# Patient Record
Sex: Male | Born: 1960
Health system: Southern US, Community
[De-identification: ages and names within clinical notes are randomized; demographics above are authoritative.]

## PROBLEM LIST (undated history)

## (undated) DIAGNOSIS — E785 Hyperlipidemia, unspecified: Secondary | ICD-10-CM

## (undated) DIAGNOSIS — K219 Gastro-esophageal reflux disease without esophagitis: Secondary | ICD-10-CM

## (undated) DIAGNOSIS — G5700 Lesion of sciatic nerve, unspecified lower limb: Secondary | ICD-10-CM

## (undated) DIAGNOSIS — M7541 Impingement syndrome of right shoulder: Secondary | ICD-10-CM

## (undated) DIAGNOSIS — K5730 Diverticulosis of large intestine without perforation or abscess without bleeding: Secondary | ICD-10-CM

## (undated) DIAGNOSIS — K649 Unspecified hemorrhoids: Secondary | ICD-10-CM

## (undated) DIAGNOSIS — J45909 Unspecified asthma, uncomplicated: Secondary | ICD-10-CM

## (undated) DIAGNOSIS — M109 Gout, unspecified: Secondary | ICD-10-CM

## (undated) DIAGNOSIS — K429 Umbilical hernia without obstruction or gangrene: Secondary | ICD-10-CM

## (undated) DIAGNOSIS — M1990 Unspecified osteoarthritis, unspecified site: Secondary | ICD-10-CM

## (undated) DIAGNOSIS — I861 Scrotal varices: Secondary | ICD-10-CM

## (undated) DIAGNOSIS — G709 Myoneural disorder, unspecified: Secondary | ICD-10-CM

## (undated) HISTORY — DX: Lesion of sciatic nerve, unspecified lower limb: G57.00

## (undated) HISTORY — DX: Unspecified osteoarthritis, unspecified site: M19.90

## (undated) HISTORY — DX: Diverticulosis of large intestine without perforation or abscess without bleeding: K57.30

## (undated) HISTORY — DX: Gastro-esophageal reflux disease without esophagitis: K21.9

## (undated) HISTORY — DX: Umbilical hernia without obstruction or gangrene: K42.9

## (undated) HISTORY — DX: Hyperlipidemia, unspecified: E78.5

## (undated) HISTORY — DX: Myoneural disorder, unspecified: G70.9

## (undated) HISTORY — DX: Scrotal varices: I86.1

## (undated) HISTORY — DX: Unspecified hemorrhoids: K64.9

## (undated) HISTORY — DX: Unspecified asthma, uncomplicated: J45.909

---

## 2000-11-18 ENCOUNTER — Emergency Department (HOSPITAL_COMMUNITY): Admission: EM | Admit: 2000-11-18 | Discharge: 2000-11-18 | Payer: Self-pay | Admitting: Emergency Medicine

## 2000-11-18 ENCOUNTER — Encounter: Payer: Self-pay | Admitting: Emergency Medicine

## 2002-07-03 ENCOUNTER — Encounter: Payer: Self-pay | Admitting: Emergency Medicine

## 2002-07-03 ENCOUNTER — Emergency Department (HOSPITAL_COMMUNITY): Admission: EM | Admit: 2002-07-03 | Discharge: 2002-07-03 | Payer: Self-pay | Admitting: Emergency Medicine

## 2013-05-04 ENCOUNTER — Emergency Department (HOSPITAL_COMMUNITY)
Admission: EM | Admit: 2013-05-04 | Discharge: 2013-05-05 | Disposition: A | Discharge status: Home / Self Care | Payer: No Typology Code available for payment source | Attending: Emergency Medicine | Admitting: Emergency Medicine

## 2013-05-04 ENCOUNTER — Emergency Department (HOSPITAL_COMMUNITY): Payer: No Typology Code available for payment source

## 2013-05-04 ENCOUNTER — Encounter (HOSPITAL_COMMUNITY): Payer: Self-pay | Admitting: Emergency Medicine

## 2013-05-04 DIAGNOSIS — M545 Low back pain, unspecified: Secondary | ICD-10-CM | POA: Insufficient documentation

## 2013-05-04 DIAGNOSIS — M542 Cervicalgia: Secondary | ICD-10-CM | POA: Insufficient documentation

## 2013-05-04 DIAGNOSIS — Y92410 Unspecified street and highway as the place of occurrence of the external cause: Secondary | ICD-10-CM | POA: Insufficient documentation

## 2013-05-04 DIAGNOSIS — M25519 Pain in unspecified shoulder: Secondary | ICD-10-CM | POA: Insufficient documentation

## 2013-05-04 DIAGNOSIS — M25522 Pain in left elbow: Secondary | ICD-10-CM

## 2013-05-04 DIAGNOSIS — Y939 Activity, unspecified: Secondary | ICD-10-CM | POA: Insufficient documentation

## 2013-05-04 DIAGNOSIS — M546 Pain in thoracic spine: Secondary | ICD-10-CM | POA: Insufficient documentation

## 2013-05-04 DIAGNOSIS — M25512 Pain in left shoulder: Secondary | ICD-10-CM

## 2013-05-04 DIAGNOSIS — M25529 Pain in unspecified elbow: Secondary | ICD-10-CM | POA: Insufficient documentation

## 2013-05-04 DIAGNOSIS — M549 Dorsalgia, unspecified: Secondary | ICD-10-CM

## 2013-05-04 MED ORDER — CYCLOBENZAPRINE HCL 5 MG PO TABS: 5.0000 mg | ORAL_TABLET | Freq: Three times a day (TID) | ORAL | Status: DC | PRN

## 2013-05-04 MED ORDER — NAPROXEN 500 MG PO TABS: 500.0000 mg | ORAL_TABLET | Freq: Two times a day (BID) | ORAL | Status: DC
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
## 2013-05-04 NOTE — Discharge Instructions (Signed)
Follow-up with your doctor if your symptoms are not improving or worsening Take naprosyn for pain - take twice daily with food  Take flexeril for muscle relaxation - be careful this can make your drowsy, do not drive while taking this medication Return to the ED if you have any worsening signs/symptoms, severe headache/chest pain/abdominal pain, repeated vomiting, blood in your vomit/stool/urine, weakness, loss of bowel/bladder function, loss of sensation, or any other concerns (see below)     Colisin con un vehculo de motor Furniture conservator/restorer) Luego de una colisin, es comn presentar mltiples moretones y dolores musculares. Estas molestias generalmente empeoran durante las primeras 24 horas. Usted gradualmente se pondr ms rgido y con ms dolor en las horas siguientes. Podr sentirse peor cuando despierte en la maana siguiente al accidente. A partir de all, debera comenzar a Charity fundraiser que pase. La velocidad con que se mejora generalmente depende de la gravedad de la colisin, la cantidad de lesiones y la ubicacin y Chiropractor de las mismas. INSTRUCCIONES PARA EL CUIDADO EN EL HOGAR   Aplique hielo sobre la zona lesionada.  Ponga el hielo en una bolsa plstica.  Colquese una toalla entre la piel y la bolsa de hielo.  Deje el hielo durante 15 a 20 minutos, 3 a 4 veces por da.  Debe ingerir gran cantidad de lquido para mantener la orina de tono claro o color amarillo plido.  No beba alcohol.  Tome una ducha o un bao caliente o bese una o dos veces por da. Esto aumentar el flujo de [employer]cker msculos doloridos.  Puede volver a sus ocupaciones cuando se lo indique el mdico. Tenga cuidado al levantar objetos, ya que puede agravar el dolor en el cuello o en la espalda.  Utilice los medicamentos de venta libre o de prescripcin para Conservation officer, historic buildings, Health and safety inspector o la [place], segn se lo indique el profesional que lo asiste. No tome aspirina. Podran aumentar los  hematomas o las hemorragias. [place] DE INMEDIATO SI SIENTE:  Entumecimiento, hormigueo, debilidad o problemas con el uso de los brazos o las piernas.  Dolor de cabeza intenso que no mejora con medicamentos.  Siente dolor intenso en el cuello, especialmente sensibilidad en el centro de la espalda o el cuello.  Cambios en el control del intestino o la vejiga.  Aumento del dolor en cualquier parte del cuerpo.  Falta de aire, mareos o [place].  Siente dolor en el pecho.  Nuseas, vmitos o sudoracin.  Aumento del Tree surgeon abdominal.  Sangre en la orina, en las heces o vmitos con [place].  Siente dolor en los hombros (en la zona de los breteles).  Que sus sntomas empeoran. EST SEGURO QUE:   Comprende las instrucciones para el alta mdica.  Controlar su enfermedad.  Solicitar atencin mdica de inmediato segn las indicaciones. Document Released: 01/24/2005 Document Revised: 07/09/2011 [facility] Patient Information 2014 [place], [place].  Dolor en el hombro (Shoulder Pain)  El hombro es la articulacin que une los brazos al cuerpo. Los Affiliated Computer Services que forman la articulacin del hombro son el hueso del brazo (hmero), el omplato (escpula) y Teacher, early years/pre. La parte superior del hmero es similar a una bola y Sales promotion account executive en una cavidad ms bien plana de la escpula (cavidad glenoidea). Una combinacin de msculos y tejidos fuertes y fibrosos que Eli Lilly and Company msculos a los huesos (tendones) sostienen la articulacin del hombro y Tree surgeon la bola en la cavidad. En diferentes zonas de la articulacin hay pequeas bolsas llenas de  lquido (bursa). Actan como amortiguadores Monsanto Company y los tejidos blandos que recubren y [place] a reducir la friccin [employer] tendones y [place], deslizndose al Forensic scientist. La articulacin del hombro permite una amplia gama de movimientos del brazo. Este rango de movimientos permite hacer diferentes cosas, como rascarse la espalda o  lanzar una pelota. Sin embargo, esta amplitud de movimientos del hombro tambin lo hace ms propenso al dolor por uso excesivo y por lesiones.  Las causas de dolor en el hombro pueden originarse tanto en traumatismos como en el uso excesivo y se pueden agrupar en las siguientes cuatro categoras:   Enrojecimiento, hinchazn y dolor (inflamacin) del tendn (tendinitis) o la bursa (bursitis).  Inestabilidad, como en la luxacin de la articulacin.  Inflamacin de la articulacin (artritis).  Ruptura del hueso (fractura). INSTRUCCIONES PARA EL CUIDADO EN EL HOGAR   Aplique hielo sobre la zona dolorida.  Ponga el hielo en una bolsa plstica.  Colquese una toalla entre la piel y la bolsa de hielo.  Deje el hielo durante 15 a 20 minutos 3 a 4 veces por da, durante los 2 primeros das.  Deje de usar compresas fras si no Forensic psychologist.  Si tiene un cabestrillo o inmovilizador de hombro, llvelo del modo en que su mdico le indique. Slo debe quitarlo para ducharse o baarse. Mueva el brazo lo menos posible, pero mantenga la mano en movimiento para evitar la hinchazn.  Apriete una pelota blanda o una almohadilla de goma todo lo posible para evitar la hinchazn.   Slo tome medicamentos de venta libre o recetados para Conservation officer, historic buildings, Health and safety inspector o la fiebre, segn las indicaciones de su mdico. SOLICITE ATENCIN MDICA SI:   El dolor en el hombro aumenta o siente un dolor nuevo en el brazo, la mano o los dedos.  La mano o los dedos estn fros y adormecidos.  El dolor no se alivia con los [employer]ne[employer]. SOLICITE ATENCIN MDICA DE INMEDIATO SI:   El brazo, la mano o los dedos estn adormecidos o siente hormigueos.  El brazo, la mano o los dedos estn muy hinchados o se ven blancos o azules. ASEGRESE DE QUE:   Comprende estas instrucciones.  Controlar su enfermedad.  Solicitar ayuda de inmediato si no mejora o empeora. Document Released: 01/24/2005 Document Revised:  01/09/2012 [facility] Patient Information 2014 [place], [place].  Distensin cervical  (Cervical Sprain)  Una distensin cervical es una lesin en el cuello en la que los ligamentos se estiran o se rompen. Los ligamentos son tejidos que sostienen los huesos del cuello en su Environmental consultant. Una distensin cervical puede ser desde muy leve a muy grave. La mayora mejora en 1 a 3 semanas, pero depende de la causa y la extensin de la lesin. En los casos graves pueden hacer que las vrtebras del cuello se vuelvan inestables. Esto puede causar un dao en la mdula espinal y puede dar Environmental consultant a graves problemas del [place]. Su mdico determinar si su su caso es leve o grave.  CAUSAS Las causas de una distensin cervical grave pueden ser:   Pecola Leisure prctica de ftbol americano, rugby, [place], hockey, automovilismo, gimnasia, buceo, artes Reynolds American y boxeo.  Colisiones en vehculos de motor.  Lesiones de Psychologist, counselling. Esto significa que el cuello se fuerza hacia atrs y [place].  Cadas. [place] distensiones cervicales leves pueden ser:   Adoptar posiciones incmodas, como sostener el telfono entre la oreja y [place].  Sentarse en  una silla que no ofrece el soporte adecuado.  Trabajar en una mesa de computadora mal diseada.  Las Deere & Company que requieren mirar hacia arriba o hacia abajo durante largos perodos. SNTOMAS  Dolor, sensibilidad, rigidez, o sensacin de ardor en la parte anterior, posterior o lateral del cuello. Este malestar puede desarrollarse inmediatamente despus de la lesin o puede desarrollarse lentamente y no empezar hasta 24 horas o ms despus de la lesin.  Dolor o sensibilidad que se siente directamente en la parte media posterior del cuello.  Dolor en el hombro o la zona superior de la espalda.  Capacidad limitada para mover el cuello.  Dolor de [place].  Mareos.  Debilidad, entumecimiento u hormigueo en las manos o los brazos.  Espasmos  musculares.  Dificultad para tragar o masticar.  Sensibilidad e hinchazn en el cuello. [place] veces, el mdico puede diagnosticar este problema mediante la historia clnica y un examen fsico. Su mdico le preguntar acerca de problemas conocidos,como artritis en el cuello o una lesin previa en el cuello. Podrn tomarle radiografas para determinar si hay otros problemas, como enfermedades en los huesos del cuello. Sin embargo, en general las radiografas no revelan una distensin cervical en su totalidad. Puede ser necesario realizar otras pruebas, como una tomografa computada o la resonancia magntica.  TRATAMIENTO El tratamiento depende de la gravedad de la distensin. Las distensiones leves se pueden tratar con reposo, manteniendo el cuello en su lugar (inmobilizacin) y usando medicamentos para Conservation officer, historic buildings. Las distensiones cervicales graves necesitan inmovilizacin inmediata y Ardis Rowan con un ortopedista o neurocirujano. Hay varias opciones de tratamiento disponibles para calmar el dolor, los espasmos musculares y otros sntomas. Su mdico puede recetar:   Medicamentos como calmantes para Conservation officer, historic buildings, anestsicos o relajantes musculares.  Fisioterapia. Esto puede incluir ejercicios de elongacin, fortalecimiento y Chiropodist de Advertising copywriter. Los ejercicios y [place] mejor postura pueden ayudar a estabilizar el cuello, fortalecer los msculos y Product/process development scientist que los sntomas regresen.  El uso de un collar durante cortos perodos de [place]. Generalmente estos collares se usan para aumentar la comodidad. Sin embargo, ciertos collares pueden usarse   para proteger el cuello y Product/process development scientist un mayor deterioro de una distensin cervical grave. CUIDADOS EN EL HOGAR  Aplique hielo sobre la zona lesionada.  Ponga el hielo en una bolsa plstica.  Colquese una toalla entre la piel y la bolsa de hielo.  Deje el hielo durante 15 a 20 minutos, 3 a 4 veces por da.  Slo tome medicamentos de  venta libre o prescriptos para Glass blower/designer, las molestias o bajar la fiebre segn las indicaciones de su mdico.  Cumpla con todas las visitas de control, segn le indique su mdico.  Cumpla con todas las sesiones de fisioterapia, segn le indique su mdico.  Si le indican el uso de un collar, selo segn las indicaciones del mdico.  No conduzca vehculos mientras [place] el collar.  Haga los ajustes necesarios en su lugar de trabajo para favorecer una buena postura.  Evite las posiciones y actividades que ONEOK sntomas.  Haga precalentamiento y elongue antes de comenzar una actividad para Customer service manager. SOLICITE ATENCIN MDICA SI:   El dolor no cesa con Haematologist.  Siente que no puede dejar de Actuary como se le indic.  No puede mejorar el nivel de actividad segn lo planeado/esperado. SOLICITE ATENCIN MDICA DE INMEDIATO SI:   Tiene algn sangrado, molestias en el estmago o signos de reaccin alrgica por  los medicamentos.  Los sntomas empeoran.  Le aparecen nuevos e inexplicables sntomas.  Siente debilidad, hormigueo, adormecimiento o parlisis en alguna parte del cuerpo. ASEGRESE DE QUE:   Comprende esas instrucciones para el alta mdica.  Controlar su enfermedad.  Solicitar ayuda de inmediato si no mejora o si empeora. Document Released: 07/13/2008 Document Revised: 07/09/2011 [facility] Patient Information 2014 [place], [place].  Dolor de espalda en el adulto (Back Pain, Adult)  El dolor de cintura es frecuente. Aproximadamente 1 de cada 5 personas lo sufren.La causa rara vez pone en peligro la vida. Con frecuencia mejora luego de algn tiempo.Alrededor de la mitad de las personas que sufren un inicio sbito de dolor de cintura, se sentirn mejor luego de 2 semanas. Aproximadamente 8 de cada 10 se sentirn mejor luego de 6 semanas.  CAUSAS  Algunas causas comunes son:   Distensin de los msculos o ligamentos que  sostienen la columna vertebral.  Desgaste (degeneracin) de los discos vertebrales.  Artritis.  Traumatismos directos en la espalda. DIAGNSTICO  La mayor parte de las veces, la causa directa no se conoce.Sin embargo, Conservation officer, historic buildings puede tratarse efectivamente an cuando no se Community education officer.Una de las formas ms precisas de asegurar que la causa del dolor no constituye un peligro es responder a las preguntas del mdico acerca de su salud y sus sntomas. Si el mdico necesita ms informacin, podr indicar anlisis de laboratorio o Optometrist un diagnstico por imgenes (radiografas o Health visitor).Sin embargo, aunque las Valero Energy modificaciones, generalmente no es necesaria la [place].  INSTRUCCIONES PARA EL CUIDADO EN EL HOGAR  En algunas personas, el dolor de espalda vuelve.Como rara vez es peligroso, los pacientes pueden aprender a Education administrator.   Mantngase activo. Si permanece sentado o de pie mucho tiempo en el mismo lugar, se tensiona la espalda.  No se siente, maneje ni se quede parado en un mismo lugar por ms de 30 minutos. Realice caminatas cortas en superficies planas ni bien el dolor haya cedido. Trate de Orthoptist tiempo que camina .  No se quede en la cama.Si hace reposo durante ms de 1 o 2 das, puede Geologist, engineering.  No evite los ejercicios ni el trabajo.El cuerpo est hecho para moverse.No es peligroso estar [place], aunque le duela la espalda.La espalda se curar ms rpido si contina sus actividades antes de que el dolor se vaya.  Preste atencin a su cuerpo cuando se incline y se levante. Muchas personas sienten menos molestias cuando levantan objetos si doblan las rodillas, mantienen la carga cerca del cuerpo y evitan torcerse. Generalmente, las posiciones ms cmodas son las que ejercen menos tensin en la espalda en recuperacin.  Encuentre una posicin cmoda para dormir. Utilice un colchn firme y recustese de  [place]. Doble ligeramente sus rodillas. Si se recuesta sobre su espalda, coloque una almohada debajo de sus rodillas.  Tome slo medicamentos de venta libre o recetados, segn las indicaciones del mdico. Los medicamentos de venta libre para Glass blower/designer y reducir Futures trader, son los que en general ms ayudan.El mdico podr prescribirle relajantes musculares.Estos medicamentos calman el dolor de modo que pueda retornar a sus actividades normales y a Marine scientist.  Aplique hielo sobre la zona lesionada.  Ponga el hielo en una bolsa plstica.  Colquese una toalla entre la piel y la bolsa de hielo.  Deje la bolsa de hielo durante 15 a 20 minutos 3 a 4 veces por da, durante los primeros 2  3  das. Elveria Royals podr alternar Lyndal Pulley calor y hielo para reducir Conservation officer, historic buildings y los espasmos.  Consulte a su mdico si puede tratar de hacer ejercicios para la espalda y recibir un masaje suave. Pueden ser beneficiosos.  Evite sentirse ansioso o estresado.El estrs aumenta la tensin muscular y puede empeorar el dolor de espalda.Es importante reconocer cuando est ansioso o estresado y aprender la forma de controlarlos.El ejercicio es una gran opcin. SOLICITE ATENCIN MDICA SI:   Siente un dolor que no se alivia con reposo o medicamentos.  El dolor no mejora en 1 semana.  Desarrolla nuevos sntomas.  No se siente bien en general. SOLICITE ATENCIN MDICA DE INMEDIATO SI:  Siente un dolor que se irradia desde la espalda hacia sus piernas.  Desarrolla nuevos problemas en el intestino o la vejiga.  Siente debilidad o adormecimiento inusual en sus brazos o piernas.  Presenta nuseas o vmitos.  Presenta dolor abdominal.  Se siente desfalleciente. Document Released: 04/16/2005 Document Revised: 10/16/2011 [facility] Patient Information 2014 [place], [place].   Emergency Department Resource Guide 1) Find a Doctor and Pay Out of Pocket Although you won't have to find out  who is covered by your insurance plan, it is a good idea to ask around and get recommendations. You will then need to call the office and see if the doctor you have chosen will accept you as a new patient and what types of options they offer for patients who are self-pay. Some doctors offer discounts or will set up payment plans for their patients who do not have insurance, but you will need to ask so you aren't surprised when you get to your appointment.  2) Contact Your Local Health Department Not all health departments have doctors that can see patients for sick visits, but many do, so it is worth a call to see if yours does. If you don't know where your local health department is, you can check in your phone book. The CDC also has a tool to help you locate your state's health department, and many state websites also have listings of all of their local health departments.  3) Find a [facility] If your illness is not likely to be very severe or complicated, you may want to try a walk in clinic. These are popping up all over the country in pharmacies, drugstores, and shopping centers. They're usually staffed by nurse practitioners or physician assistants that have been trained to treat common illnesses and complaints. They're usually fairly quick and inexpensive. However, if you have serious medical issues or chronic medical problems, these are probably not your best option.  No Primary Care Doctor: - Call Health Connect at  [phone] - they can help you locate a primary care doctor that  accepts your insurance, provides certain services, etc. - Physician Referral Service- [phone]  Chronic Pain Problems: Organization         Address  Phone   Notes  [facility]  [phone] Patients need to be referred by their primary care doctor.   Medication Assistance: Organization         Address  Phone   Notes  [facility] Medication [facility] [address] [place] [place], [place] [address] [phone] --Must be a resident of [facility] -- Must have NO insurance coverage whatsoever (no Medicaid/ Medicare, etc.) -- The pt. MUST have a primary care doctor that directs their care regularly and follows them in the community   MedAssist  (  [phone]   [employer]  [phone]    Agencies that provide inexpensive medical care: Organization         Address  Phone   Notes  [employer]  [phone]   [facility]    [phone]   [facility] [place], [place] [address] [phone]   [place] [address] [place] [phone]   Planned Parenthood    [phone]   [facility]    [phone]   [place] and [address], [place] Phone:  [phone], Fax:  [phone] Hours of Operation:  9 am - 6 pm, M-F.  Also accepts Medicaid/Medicare and self-pay.  [facility] for [place], [address], [place] Phone: [phone], Fax: [phone]. Hours of Operation:  8:30 am - 5:30 pm, M-F.  Also accepts Medicaid and self-pay.  [facility] [place] [facility] [address], [place] Phone: [phone]   [place], [place], [place] [phone], Ext. [phone] Mondays & Thursdays: 7-9 AM.  First 15 patients are seen on a first come, first serve basis.    [place] Providers:  Organization         Address  Phone   Notes  [facility] [address], [place] [phone] Also accepts self-pay patients.  [facility] [id number] [place], [place]  [phone]   [place], [address], [place] [phone]   [facility] [address] [place] [phone]   Lucianne Lei  [address], [place]   [phone] Only accepts [place] Access [place] patients after they have their name applied to their card.   Self-Pay (no insurance) in [facility]:  Organization         Address  Phone   Notes  Sickle Cell Patients, [facility] [place] [phone]   [facility] [place] [phone]   [facility] [place]  [place], [place], [place] [phone]   [facility]/Dr. Osei-Bonsu  [address], [place] or [address], [place] [phone] Phone number for both [place] and [place] locations is the same.  [facility] [address], [place] [phone]   [facility] [address], [place] or [address] [phone]   [facility] [address], [place] [phone], phone; [phone], fax Sees patients 1st and 3rd Saturday of every month.  Must not qualify for public or private insurance (i.e. Medicaid, Medicare, [place] Health Choice, Veterans' Benefits)  Household income should be no more than 200% of the poverty level The clinic cannot treat you if you are pregnant or think you are pregnant  Sexually transmitted diseases are not treated at the clinic.    Dental Care: Organization         Address  Phone  Notes  [facility] [place] [phone] Accepts children up to age 62 who are enrolled in [place] or [place]; pregnant women with  a Medicaid card; and children who have applied for Medicaid or [place] Health Choice, but were declined, whose parents can pay a reduced fee at time of service.  [facility] Department of [facility]  [address], [place] [phone] Accepts children up to age 14  who are enrolled in [place] or [place]; pregnant women with a Medicaid card; and children who have applied for Medicaid or [place] Health Choice, but were declined, whose parents can pay a reduced fee at time of service.  [place] Adult [facility] Access [facility]  [place] [phone] Patients are seen by appointment only. Walk-ins are not accepted. [place] will see patients 33 years of age and older. Monday - Tuesday (8am-5pm) Most Wednesdays (8:30-5pm) $30 per visit, cash only  [facility] Adult Dental Access PROGRAM  [address], [facility] [phone] Patients are seen by appointment only. Walk-ins are not accepted. [place] will see patients 66 years of age and older. One Wednesday Evening (Monthly: Volunteer Based).  $30 per visit, cash only  [place]  [phone] for adults; Children under age 72, call Graduate Pediatric Dentistry at [phone]. Children aged 58-14, please call [phone] to request a pediatric application.  Dental services are provided in all areas of dental care including fillings, crowns and bridges, complete and partial dentures, implants, gum treatment, root canals, and extractions. Preventive care is also provided. Treatment is provided to both adults and children. Patients are selected via a lottery and there is often a waiting list.   [facility] [address] [place]  [phone] [email or web address]   [facility] [address] [place], [place] [phone], Ext. [phone] Second and Fourth Thursday of each month, opens at 6:30 AM; Clinic ends at 9 AM.  Patients are seen on a first-come first-served basis, and a limited number are seen during each clinic.   [facility]  [address] Hillard Danker [place], [place] [phone]   Eligibility Requirements You must have lived in [place], [place], or [place] counties for at least the last three months.    You cannot be eligible for state or federal sponsored [employer], including [employer], [place], or [employer].   You generally cannot be eligible for healthcare insurance through your employer.    How to apply: Eligibility screenings are held every Tuesday and Wednesday afternoon from 1:00 pm until 4:00 pm. You do not need an appointment for the interview!  [facility] [address], [place], [place]   [place]  [place] Department  [place]  [phone]    Behavioral Health Resources in the Community: Intensive Outpatient Programs Organization         Address  Phone  Notes  [place]. [address] [place], [place] [phone]   [facility] [address] [place] [place], [place]   ADS: [facility] [address] [place], [place]   [place] [address],  [place], [place] or [phone]   Substance Abuse Resources Organization         Address  Phone  Notes  [facility]  [phone]   [facility]  [phone]   [facility] [place]   [facility]  [phone]   Residential & Outpatient Substance Abuse Program  [phone]   Psychological Services Organization         Address  Phone  Notes  [facility]  Campus  [phone]   [place] [address] [place] or [phone]    Mobile Crisis Teams Organization         Address  Phone  Notes  [facility], Mobile Crisis Care Unit  [phone]   [facility]  [address] [place], [place]   [place] Levels [address], [place] [phone]    Self-Help/Support  Groups Organization         Address  Phone             Notes  [place] [facility] [place] - variety of support groups  [place] Call for more information  Narcotics Anonymous (NA), [facility] [address], [employer] [place]  2 meetings at this location   Special educational needs teacher         Address  Phone  Notes  [facility] [place],    [place]  [phone]   [facility]  [address], [place] [address], [place], [place]   [place], [employer] [phone] Admissions: 8am-3pm M-F  Incentives Substance [place] [address]    [place], [place] [phone]   [facility] [address] [place], [place], [place]   [facility] [address],  [place], [place]   [facility] - Intensive Outpatient [address] Kristeen Mans [address], [place], [place]   [facility] ([place]  [place], [place] [phone] or [phone]   [facility] ([facility]) [address], [place], [place] Accepts Medicaid  [facility] [place] [address]  [place] [phone] Substance Abuse/Addiction Treatment   [facility] Organization         Address  Phone  Notes  [facility]  [phone]   Domenic Schwab, PhD [address] Arlis Porta [place], [place]   [phone] or [phone]   [facility] [place], [place] [phone]   [facility] [address], [place], [place] [phone] Insurance/Medicaid/sponsorship through [facility] and Families [address], [address] [place]                                    [place], [place] [phone] [place] [address], [place] [phone]    Dr. Adele Schilder  [phone]   [facility] 1) [address], [place] 2) [place] 3)  [place], [place] [phone]  [phone]   [place] [phone] or [phone] (After Hours)

## 2013-05-08 NOTE — ED Provider Notes (Signed)
Medical screening examination/treatment/procedure(s) were performed by non-physician practitioner and as supervising physician I was immediately available for consultation/collaboration.  Christopher J. Pollina, MD 05/08/13 0758 

## 2015-04-01 ENCOUNTER — Ambulatory Visit: Payer: Worker's Compensation

## 2015-04-01 ENCOUNTER — Ambulatory Visit (INDEPENDENT_AMBULATORY_CARE_PROVIDER_SITE_OTHER): Payer: Worker's Compensation | Admitting: Family Medicine

## 2015-04-01 DIAGNOSIS — R0789 Other chest pain: Secondary | ICD-10-CM

## 2015-04-01 DIAGNOSIS — M25522 Pain in left elbow: Secondary | ICD-10-CM

## 2015-04-01 DIAGNOSIS — M542 Cervicalgia: Secondary | ICD-10-CM

## 2015-04-01 DIAGNOSIS — M25512 Pain in left shoulder: Secondary | ICD-10-CM

## 2015-04-01 DIAGNOSIS — M549 Dorsalgia, unspecified: Secondary | ICD-10-CM | POA: Diagnosis not present

## 2015-04-01 MED ORDER — CYCLOBENZAPRINE HCL 5 MG PO TABS: ORAL_TABLET | ORAL | Status: DC
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
## 2015-04-05 ENCOUNTER — Ambulatory Visit (INDEPENDENT_AMBULATORY_CARE_PROVIDER_SITE_OTHER): Payer: Worker's Compensation | Admitting: Family Medicine

## 2015-04-05 DIAGNOSIS — S5002XD Contusion of left elbow, subsequent encounter: Secondary | ICD-10-CM | POA: Diagnosis not present

## 2015-04-05 DIAGNOSIS — S161XXD Strain of muscle, fascia and tendon at neck level, subsequent encounter: Secondary | ICD-10-CM

## 2015-04-05 DIAGNOSIS — S39012D Strain of muscle, fascia and tendon of lower back, subsequent encounter: Secondary | ICD-10-CM | POA: Diagnosis not present

## 2015-04-05 NOTE — Patient Instructions (Addendum)
Flexeril (receta a [facility]) si necesario en la noche por musculos.  papel de restrictiones a su trabaja.  Ibuprofen si necesario.  regrese en 3 dias. Mas temprano si empeorse.
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
## 2015-04-08 ENCOUNTER — Ambulatory Visit (INDEPENDENT_AMBULATORY_CARE_PROVIDER_SITE_OTHER): Payer: Worker's Compensation | Admitting: Family Medicine

## 2015-04-08 ENCOUNTER — Ambulatory Visit: Payer: Worker's Compensation

## 2015-04-08 VITALS — BP 120/74 | HR 85 | Temp 97.8°F | Resp 16 | Ht 60.25 in | Wt 159.6 lb

## 2015-04-08 DIAGNOSIS — M545 Low back pain, unspecified: Secondary | ICD-10-CM

## 2015-04-08 DIAGNOSIS — S161XXD Strain of muscle, fascia and tendon at neck level, subsequent encounter: Secondary | ICD-10-CM

## 2015-04-08 MED ORDER — MELOXICAM 7.5 MG PO TABS: 7.5000 mg | ORAL_TABLET | Freq: Every day | ORAL | Status: DC

## 2015-04-08 NOTE — Patient Instructions (Signed)
meloxicam 1 a 2 veces cada dia.  No otro medicina que tylenol y cyclobenzaprine con esta medicina. regrese en 5 dias. Mas temprano si empeorse.   Distensin cervical (Cervical Sprain) Una distensin cervical es una lesin en el cuello, en la que los tejidos fuertes y fibrosos (ligamentos) que unen los huesos del cuello, se distienden o se rompen. Una distensin cervical puede ser desde muy leve a muy grave. En los casos graves pueden hacer que las vrtebras del cuello se vuelvan inestables. Esto puede causar un dao en la mdula espinal y puede dar Environmental consultant a graves problemas del [place]. La cantidad de tiempo que demora la mejora de una distensin cervical depende de la causa y de la extensin de la lesin. [place] veces se cura en 1 a 3 semanas. CAUSAS  Las distensiones graves pueden ser causadas por:   Lesiones por deportes de contacto (como en el ftbol Scientist, forensic, rugby, [place], hockey, automovilismo, gimnasia, buceo, artes Reynolds American y boxeo).  Colisiones en vehculos de motor.  Lesiones de Buyer, retail cervical. Esta es una lesin por movimiento brusco de adelante hacia atrs de la cabeza y el cuello.  Cadas. [place] distensiones cervicales leves pueden ser:   Adoptar posiciones incmodas, como sostener el telfono entre la oreja y [place].  Sentarse en una silla que no ofrece el soporte adecuado.  Trabajar en una mesa de computadora mal diseada.  Las [employer]ere [employer] Company que requieren mirar hacia arriba o hacia abajo durante largos perodos. SNTOMAS   Dolor, sensibilidad, rigidez, o sensacin de ardor en la parte anterior, posterior o lateral del cuello. Este malestar puede aparecer inmediatamente despus de la lesin o puede desarrollarse lentamente y no empezar hasta 24 horas o ms despus de la lesin.  Dolor o sensibilidad que se siente directamente en la parte media posterior del cuello.  Dolor en el hombro o la zona superior de la espalda.  Capacidad  limitada para mover el cuello.  Dolor de [place].  Mareos.  Debilidad, entumecimiento u hormigueo en las manos o los brazos.  Espasmos musculares.  Dificultades para tragar o comer.  Sensibilidad e hinchazn en el cuello. [place] veces, el mdico puede diagnosticar este problema mediante la historia clnica y un examen fsico. Su mdico le preguntar acerca de lesiones previas y problemas conocidos como artritis en el cuello. Podrn tomarle radiografas para determinar si hay otros problemas, como enfermedades en los huesos del cuello. Tambin puede ser [employer]state realizar otras pruebas, como tomografas computadas o Health visitor.  TRATAMIENTO  El tratamiento depende de la gravedad de la distensin. Las distensiones leves se pueden tratar con reposo, manteniendo el cuello en su lugar (inmovilizacin) y usando medicamentos para Conservation officer, historic buildings. Las distensiones graves deben ser inmediatamente inmovilizadas. Ser necesario completar el tratamiento para Best boy, los espasmos musculares y otros sntomas, y puede incluir.  Medicamentos como calmantes para el dolor, anestsicos o relajantes musculares.  Fisioterapia. Esto puede incluir ejercicios de elongacin, fortalecimiento y Chiropodist de Advertising copywriter. Los ejercicios y [place] mejor postura pueden ayudar a estabilizar el cuello, fortalecer los msculos y evitar que los sntomas vuelvan a Arts administrator. INSTRUCCIONES PARA EL CUIDADO EN EL HOGAR   Aplique hielo sobre la zona lesionada.  Ponga el hielo en una bolsa plstica.  Colquese una toalla entre la piel y la bolsa de hielo.  Deje el hielo durante 15 - 20 minutos y aplquelo 3 - 4 veces por Training and development officer.  Si la lesin fue  grave, le indicarn el uso de un collarn cervical. El collarn cervical es un collar de dos piezas para impedir que el cuello se mueva [place] se [place].  Nose quite el collarn excepto que se lo indique su mdico.  Si tiene el cabello largo,  mantngalo fuera del collarn.  Consulte a su mdico antes de hacerle ajustes. Los [employer]ot pueden ser requeridos con el tiempo para Garment/textile technologist confort y reducir la presin sobre la barbilla o en la parte posterior de la cabeza.  Si le permiten quitarse el collarn para lavarlo o darse un bao, siga las indicaciones de su mdico acerca de cmo hacerlo con seguridad.  Mantenga el collarn limpio pasando un pao con agua y [place] y secndolo bien. Si el collarn tiene almohadillas removibles, qutelas cada 1-2 das para lavarlas a mano con agua y [place]. Deje que se sequen al aire. Debe secarlas bien antes de volver a colocarlas en el collarn.  Si le permiten quitarse el collarn para lavarlo y darse un bao, lave y seque la piel del cuello. Controle su piel para detectar irritacin o llagas. Si las tiene, informe a su mdico.  No conduzca vehculos mientras [place] el collarn.  Slo tome medicamentos de venta libre o recetados para Glass blower/designer, el malestar o bajar la fiebre, segn las indicaciones de su mdico.  Cumpla con todas las visitas de control, segn le indique su mdico.  Cumpla con todas las sesiones de fisioterapia, segn le indique su mdico.  Haga los ajustes necesarios en su lugar de [place] para favorecer una buena postura.  Evite las posiciones y actividades que ONEOK sntomas.  Haga precalentamiento y elongue antes de comenzar una actividad para Customer service manager. SOLICITE ATENCIN MDICA SI:   El dolor no se alivia con los [employer]negy.  No puede disminuir la dosis de analgsicos segn lo planificado.  Su nivel de actividad no mejora segn lo esperado. SOLICITE ATENCIN MDICA DE INMEDIATO SI:   Presenta cualquier hemorragia.  Siente Higher education careers adviser.  Tiene signos de reaccin alrgica a los medicamentos.  Los sntomas empeoran.  Le aparecen sntomas nuevos e inexplicables.  Siente adormecimiento, hormigueo, debilidad o parlisis en alguna  parte del cuerpo. ASEGRESE DE QUE:   Comprende estas instrucciones.  Controlar su afeccin.  Recibir ayuda de inmediato si no mejora o si empeora.   Esta informacin no tiene Marine scientist el consejo del mdico. Asegrese de hacerle al mdico cualquier pregunta que tenga.   Document Released: 07/13/2008 Document Revised: 02/04/2013 Elsevier Interactive Patient Education 2016 [place] con rehabilitacin (Low Back Sprain With Rehab) Un esguince es una lesin en la que el ligamento se desgarra. Los ligamentos de la cintura son susceptibles de sufrir esguinces. Sin embargo, estos ligamentos son Orlene Erm fuertes y se requiere de una gran fuerza para lesionarlos. Son importantes para estabilizar la mdula [place] esguinces se clasifican en tres categoras. Los esguinces de grado 1 ocasionan dolor, pero el tendn no est alargado. En los esguinces de grado 2 hay un ligamento alargado, debido a un estiramiento o desgarro parcial. En el esguince de [place] 2 an se mantiene la funcin, aunque sta puede estar alterada. Un esguince en grado 3 es la ruptura completa del msculo o el tendn, y suele quedar incapacitada la funcin. SNTOMAS  Dolor intenso en la cintura.  Sensacin de estallido o ruptura en el momento de la lesin.  Sensibilidad y a veces hinchazn en la zona de la lesin.  Algunas  veces, hematoma (contusin) en el lugar de la lesin dentro de las 48 horas.  Espasmos musculares en la espalda. CAUSAS El esguince se produce cuando se aplica una fuerza en el ligamento que es mayor de lo que puede soportar. Las causas ms frecuentes de la lesin son:  Sherrye Payor actividad estresante en una posicin incmoda.  Actividades estresantes repetidas que implican movimiento de la cintura.  [place] directo en la cintura (traumatismo). LOS RIESGOS AUMENTAN CON:  Deportes de contacto (ftbol, lucha).  Colisiones (principalmente accidentes de  esqu).  Deportes que requieren arrojar o Retail banker elemento (levantamiento de pesas, bisbol).  Deportes que implican girar la columna (gimnasia, clavados, tenis, golf)  Poca fuerza y flexibilidad.  Proteccin inadecuada.  Cirugas previas en la espalda (especialmente fusin). PREVENCIN  Use el equipo protector adecuado y ONEOK.  Precalentamiento adecuado y elongacin antes de la [place].  Descanso y recuperacin entre actividades.  Mantener la forma fsica:  Kerry Hough, flexibilidad y resistencia muscular.  Capacidad cardiovascular.  Mantenga un peso corporal adecuado. PRONSTICO Si se trata adecuadamente, estos esguinces pueden curarse con tratamiento no quirrgico. El tiempo de curacin depende de la gravedad de la lesin.  posibles complicaciones:  La recurrencia frecuente de los sntomas puede dar como resultado un problema crnico.  Inflamacin crnica y dolor en la cintura.  Retraso en la curacin o resolucin de los sntomas, en particular si se retoma la actividad rpidamente.  Discapacidad prolongada.  Articulacin inestable o artrtica en la cintura. TRATAMIENTO El tratamiento inicial incluye el uso de medicamentos y la aplicacin de hielo para reducir Conservation officer, historic buildings y la inflamacin. Los ejercicios de elongacin y fortalecimiento pueden ayudar a reducir Conservation officer, historic buildings con la [place]. Los ejercicios pueden Press photographer o con un terapeuta. Los Apple Computer graves pueden requerir la derivacin a un fisioterapeuta para Film/video editor evaluacin y Medical laboratory scientific officer un tratamiento, como ultrasonido. El profesional podr indicarle el uso de un dispositivo ortopdico para ayudar a Dietitian y la inflamacin. A menudo, demasiado reposo en cama podr resultar en ms daos que beneficios. Podrn prescribirle inyecciones de corticoides. Sin embargo, esto deber reservarse para los casos ms graves. Es Theatre manager uso de la espalda cuando se levantan objetos.  Por la noche, se aconseja que usted [place], sobre un colchn firme y coloque una almohada debajo de las rodillas. Si no se obtiene xito con Music therapist, ser necesario someterse a [place].  MEDICAMENTOS   Si es necesaria la administracin de medicamentos para Conservation officer, historic buildings, se recomiendan los antiinflamatorios no esteroides, como aspirina e ibuprofeno y otros calmantes menores, como acetaminofeno.  No tome medicamentos para el dolor dentro de los 7 das previos a la [place].  El profesional podr prescribirle calmantes si lo considera necesario. Utilcelos como se le indique y slo cuando lo necesite.  Podr beneficiarse con [employer] Cla[employer]orne.  En algunos casos se indica una inyeccin de corticosteroides. Estas inyecciones deben reservarse para los casos graves, porque slo se pueden administrar una determinada cantidad de veces. CALOR Y FRO   El fro (con hielo) debe aplicarse durante 10 a 15 minutos cada 2  3 horas para reducir la inflamacin y Conservation officer, historic buildings e inmediatamente despus de cualquier actividad que agrava los sntomas. Utilice bolsas o un masaje de hielo.  El calor puede usarse antes de Neurosurgeon y [place] fortalecimiento indicadas por el profesional, le fisioterapeuta o Industrial/product designer. Utilice una bolsa trmica o un pao hmedo. SOLICITE  ATENCIN MDICA SI:   Los sntomas empeoran o no mejoran en 2 a 4 semanas, an realizando Lexicographer.  Presenta adormecimiento o debilidad en alguna de las piernas.  Prdida del control del intestino o de la vejiga.  Luego de la ciruga observa lo siguiente: fiebre, dolor intenso, hinchazn, enrojecimiento, drena lquido o sangra en la regin de la herida.  Desarrolla nuevos e inexplicables sntomas. (Los Dynegy utilizados en el tratamiento le ocasionan efectos secundarios). [place] personas con  dolor de espalda baja encuentran que sus sntomas empeoran al doblarse hacia adelante (flexin) o al arquear la regin inferior de la espalda (extensin). Los ejercicios que le ayudarn a Investment banker, operational sus sntomas se Furniture conservator/restorer.  El mdico, fisioterapeuta o Radiation protection practitioner ayudarn a Teacher, adult education qu ejercicios sern de ayuda para resolver su dolor de espalda. No realice ningn ejercicio sin consultarlo antes con el profesional. Discontine los ejercicios que empeoran sus sntomas, hasta que hable con el mdico. Si siente dolor, entumecimiento u hormigueo que [employer] Wholesale glteos, piernas o pies, el objetivo de esta terapia es que estos sntomas se acerquen a la espalda y Occupational hygienist. A veces, estos sntomas en las piernas mejoran, pero el dolor de espalda empeora. Este suele ser un indicio de progreso en su rehabilitacin. Asegrese de que estar atento a cualquier cambio en sus sntomas y las actividades que ha General Electric 24 horas antes del cambio. Compartir esta informacin con su mdico le permitir un mejor tratamiento para tratar su enfermedad. Estos ejercicios le ayudarn en la recuperacin de la lesin. Los sntomas podrn aliviarse con o sin asistencia adicional de su mdico, fisioterapeuta o Administrator, sports. Al completar estos ejercicios, recuerde:   Restaurar la flexibilidad del tejido ayuda a que las articulaciones recuperen el movimiento normal. Esto permite que el movimiento y la actividad sea ms saludables y menos dolorosos.  Para que sea efectiva, cada elongacin debe realizarse durante al menos 30 segundos.  La elongacin nunca debe ser dolorosa. Deber sentir slo un alargamiento o distensin suave del tejido que estira. EJERCICIOS DE AMPLITUD DE MOVIMIENTOS Y ELONGACIN: ELONGACION Flexin - una rodilla al pecho  Recustese en una cama dura o sobre el piso, con ambas piernas extendidas al frente.  Manteniendo una pierna en contacto con el piso,  lleve la rodilla opuesta al pecho. Mantenga la pierna en esa posicin, sostenindola por la zona posterior del muslo o por la rodilla.  Presione hasta sentir un suave estiramiento en la cintura. Mantenga esta posicin durante __________ segundos.  Libere la pierna lentamente y repita el ejercicio con el lado opuesto. Reptalo __________ veces. Realice este ejercicio __________ veces por da.  ELONGACIN - Flexin, dos rodillas al pecho   Recustese en una cama dura o sobre el piso, con ambas piernas extendidas al frente.  Manteniendo una pierna en contacto con el piso, lleve la rodilla opuesta al pecho.  Tense los msculos del estmago para apoyar la espalda y levante la otra rodilla [place]. Mantenga las piernas en su lugar y tmese por detrs [place].  Con ambas rodillas en el pecho, tire hasta que sienta un estiramiento en la parte trasera de la espalda. Mantenga esta posicin durante __________ segundos.  Tense los msculos del estmago y baje las piernas de a una por vez. Reptalo __________ veces. Realice este ejercicio __________ veces por da.  [place]  de la zona baja del tronco  Recustese sobre una cama firme o sobre el suelo. [place], doble las rodillas de modo que ambas apunten hacia el techo y los pies queden bien apoyados en el piso.  Extienda los brazos a Teaching laboratory technician. Esto estabilizar la zona superior del cuerpo, manteniendo los hombros en contacto con el piso.  Con cuidado y lentamente deje caer ambas rodillas juntas hacia un lado, hasta que sienta un suave estiramiento en la espalda baja. Mantenga esta posicin durante __________ segundos.  Tensione los Apple Computer del estmago para Nature conservation officer la cintura mientras lleva las rodillas nuevamente a la posicin [place]. Reptalo __________ veces. Realice este ejercicio __________ veces por da. EJERCICIOS DE AMPLITUD DE  MOVIMIENTOS Y FLEXIBILIDAD: ELONGACIN - Extensin posicin prona sobre los codos  Acustese sobre el estmago sobre el piso, una cama ser muy blanda. Coloque las palmas a una distancia igual al ancho de los hombres y a la altura de la cabeza.  Coloque los codos bajo los hombros. Si siente dolor, colquese almohadas debajo del pecho.  Deje que su cuerpo se relaje, de modo que las caderas queden ms abajo y tengan ms contacto con el piso.  Mantenga esta posicin durante __________ segundos.  Vuelva lentamente a la posicin plana sobre el piso. Reptalo __________ veces. Realice este ejercicio __________ veces por da.  [place] de brazos en posicin prona  Acustese sobre el [employer] piso, una cama ser [place]. Coloque las palmas a una distancia igual al ancho de los hombres y a la altura de la cabeza.  Mantenga la espalda tan relajada como pueda, enderece lentamente los codos mientras mantiene las caderas contra el suelo. Puede modificar la posicin de las manos para estar ms cmodo. A medida que gana movimiento, sus manos quedarn ms por debajo de los hombros.  Mantenga cada posicin durante __________ segundos.  Vuelva lentamente a la posicin plana sobre el piso. Reptalo __________ veces. Realice este ejercicio __________ veces por da.  AMPLITUD DE MOVIMIENTOS - Cuadrpedo [place] vertebral neutral  [place] y las rodillas en una superficie firme. Las manos deben quedar a la altura de los hombros y las rodillas [place]. Puede colocar algo debajo las rodillas para estar ms cmodo.  Haga caer la cabeza y apunte el cccix hacia el suelo debajo de usted. De este modo se redondear la cintura, en [place] similar a un gato enojado. Mantenga esta posicin durante __________ segundos.  Lentamente levante la cabeza y afloje el cccix para que se hunda el cuerpo en un gran arco, como un caballo.  Mantenga esta  posicin durante __________ segundos.  Reptalo hasta sentir calor en la cintura.  Ahora encuentre su "punto ideal". Ser la posicin ms cmoda [employer] Petroleum. En esta posicin es cuando su columna est neutral. Una vez que encuentre la posicin, tensione los msculos del estmago para sostener la zona inferior de la espalda.  Mantenga esta posicin durante __________ segundos. Reptalo __________ veces. Realice este ejercicio __________ veces por da.  EJERCICIOS DE FORTALECIMIENTO - Esguince de la cintura Estos ejercicios le ayudarn en la recuperacin de la lesin. Estos ejercicios deben hacerse cerca de su "punto dulce". Este es el arco neutro, de la parte baja de la espalda, en algn lugar entre la posicin completamente redondeada y arqueada plenamente, que es la posicin menos dolorosa. Cuando se Ship broker  nivel de seguridad del movimiento, estos ejercicios se pueden Risk manager para las personas que tienen una lesin basada en flexin o extensin. Con estos ejercicios, los sntomas podrn desaparecer con o sin mayor intervencin del profesional, el fisioterapeuta o Industrial/product designer. Al completar estos ejercicios, recuerde:   Los msculos pueden ganar la resistencia y la fuerza necesarias para las actividades diarias a travs de ejercicios controlados.  Realice los ejercicios como se lo indic el mdico, el fisioterapeuta o Industrial/product designer. Aumente la resistencia y las repeticiones segn se le haya indicado.  Podr experimentar dolor o cansancio muscular, pero el dolor o molestia que trata de eliminar a travs de los ejercicios nunca debe empeorar. Si el dolor empeora, detngase y asegrese de que est siguiendo las directivas correctamente. Si an siente dolor luego de Optometrist lo ajustes necesarios, deber discontinuar el ejercicio hasta que pueda conversar con el profesional sobre el problema. FORTALECIMIENTO - Abdominales profundos - Inclinacin plvica  Recustese  sobre una cama firme o sobre el suelo. [place], doble las rodillas de modo que ambas apunten hacia el techo y los pies queden bien apoyados en el piso.  Tensione la zona baja de los msculos abdominales para presionar la Materials engineer. Este movimiento har rotar su pelvis de modo que el cccix quede hacia arriba y no apuntando a los pies o hacia el piso. Con una tensin suave y respiracin pareja, mantenga esta posicin durante __________ segundos. Reptalo __________ veces. Realice este ejercicio __________ veces por da.  FORTALECIMIENTO - Abdominales encogimiento abdominal.  Recustese sobre una cama firme o sobre el suelo. [place], doble las rodillas de modo que ambas apunten hacia el techo y los pies queden bien apoyados en el piso. [place].  Apunte suavemente con la barbilla hacia abajo, sin doblar el cuello.  Tensione los abdominales y eleve lentamente el tronco la altura suficiente para despegar los omplatos. Si se eleva ms, pondr tensin excesiva en la cintura y esto no fortalecer ms los abdominales.  Controle la vuelta a la posicin inicial. Reptalo __________ veces. Realice este ejercicio __________ veces por da.  EN CUATRO MIEMBROS - Cuadrpedo, elevacin de miembro superior e inferior opuestos   [employer] y las rodillas en una superficie firme. Las manos deben quedar a la altura de los hombros y las rodillas [place]. Puede colocar algo debajo las rodillas para estar ms cmodo.  Encuentre la posicin neutral de la columna vertebral y Heritage manager los msculos abdominales de modo que pueda mantener esta posicin. Los hombros y las caderas deben formar un rectngulo paralelo con el suelo y recto.  Manteniendo el tronco firme, eleve la mano derecha a la altura del hombro y luego eleve la pierna izquierda a la altura de la cadera. Asegrese de no contener la respiracin.  Mantenga esta posicin durante __________ segundos.  Con los msculos abdominales en tensin y la espalda firme, vuelva lentamente a la posicin inicial. Repita con el otro brazo y la otra pierna.  Reptalo __________ veces. Realice este ejercicio __________ veces por da. FUERZA - Abdominales y cudriceps - Levantar las piernas rectas  Recustese en una cama dura o sobre el piso, con ambas piernas extendidas al frente.  Deje una pierna en contacto con el suelo y doble la otra rodilla de manera que el pie quede contra el suelo.  Encuentre la posicin neutral de la columna vertebral y Heritage manager los msculos abdominales  de modo que pueda mantener esta posicin.  Levante lentamente la pierna del suelo una 6 pulgadas y cuente [place] 17, asegrese de no contener la respiracin.  Mantega la columna en posicin neutral, y baje lentamente la pierna hasta el suelo. Repita el ejercicio con cada pierna __________ veces. Realice este ejercicio __________ veces por da. CONSIDERACIONES ACERCA DE LA POSTURA Y LA MECNICA DEL CUERPO  Esguince de la cintura Si mantiene una postura correcta cuando se encuentre de pie, sentado o realizando sus actividades, reducir el estrs [employer] diferentes tejidos del cuerpo, y Advertising account executive a los tejidos lesionados la posibilidad de curarse y Engineering geologist las experiencias dolorosas. A continuacin se indican pautas generales para mejorar la postura. Su mdico o fisioterapeuta le dar instrucciones especficas segn sus necesidades. Al leer estas pautas recuerde:  Los ejercicios indicados por su mdico lo ayudarn a Scientist, product/process development flexibilidad y la fuerza para Theatre manager las posturas correctas.  La postura correcta proporciona el mejor entorno de trabajo para las articulaciones. Las articulaciones se desgastan menos cuando estn sostenidas adecuadamente por una columna vertebral en buena postura. Esto significa que su cuerpo estar ms sano y Network engineer.  La correcta postura  debe practicarse en todas las actividades, especialmente al estar sentado o de pie durante [place]. Tambin es importante al realizar actividades repetitivas de bajo estrs (tipeo) o una actividad nica y pesada. POSICIONES DE Cathe Mons Tenga en cuenta cules son las posturas que ms dolor le causan al elegir una posicin de descanso. Si siente dolor con las actividades en que deba realizar una flexin (sentarse, inclinarse, detenerse, ponerse en cuclillas), elija una posicin que le permita descansar en una postura menos flexionada. Evite curvarse en posicin fetal cuando se encuentre de lado. Si el dolor empeora con las actividades basadas en la extensin (estar de pie durante un tiempo prolongado, trabajar con las manos por arriba de la cabeza) evite descansar en Ardelia Mems posicin extendida durante mucho tiempo, como dormir sobre el [place]. La [employer] de las Artist cmodo el descanso sobre la columna vertebral en una posicin neutral, ni muy redondeada ni [place]. Recustese sobre su lado en una cama que no est hundida con una almohada entre las rodillas o sobre la espalda con una almohada bajo las rodillas, y sentir [place]. Tenga en cuenta que cualquier posicin en General Electric, no importa si es una postura [place], puede provocarle rigidez. POSTURAS CORRECTAS PARA SENTARSE Con el fin de minimizar el estrs y Health and safety inspector en su [place], deber sentarse con la postura correcta. Sentarse con una buena postura debe ser algo sin esfuerzo para un cuerpo sano. Recuperar una buena postura es un proceso gradual. Muchas personas pueden trabajar ms cmodas mediante el uso de diferentes soportes hasta que tengan la flexibilidad y la fuerza para mantener esta postura por su cuenta. Al sentarse con la [employer], los odos deben estar sobre los hombros y los hombros [place]. Debe utilizar el respaldo de la silla para apoyar la espalda. La espalda estar en una posicin  neutral, ligeramente arqueada. Puede colocar una pequea almohada o toalla doblada en la base de la espalda baja para apoyo.  Si trabaja en un escritorio, cree un ambiente que le proporciones un buen soporte y [place]. Sin apoyo adicional, msculos se cansan, lo que lleva a una tensin excesiva en las articulaciones y otros tejidos. Tenga en cuenta estas recomendaciones: SILLA:   La silla debe poder deslizarse por debajo del escritorio cuando su espalda tome contacto con  el respaldo. Esto le permitir trabajar ms cerca.  La altura de la silla debe permitirle que los ojos tengan el nivel de la parte superior del monitor y las manos estn ms abajo que los codos. POSICIN DEL CUERPO  Los pies deben tener contacto con el piso. Si no es posible, use un posapies.  Mantenga las [employer] hombros. Esto reducir el estrs en el cuello y en la cintura. POSTURAS INCORRECTAS PARA SENTARSE Si se siente cansado e incapaz de asumir una postura sentada sana, no se eche hacia atrs. Esto pone una tensin excesiva en los tejidos de su espalda, y causa ms dao y Social research officer, government. [place] opciones ms saludables se incluyen:  El uso de ms apoyo, como una almohada lumbar.  Cambio de tareas, a algo que demande una posicin vertical o caminar.  Tomar una breve caminata.  Recostarse y Physicist, medical posicin neutral. DE PIE DURANTE UN TIEMPO PROLONGADO E INCLINADO LIGERAMENTE HACIA ADELANTE Cuando deba realizar una tarea que requiera inclinacin hacia adelante estando de pie en el mismo sitio durante mucho tiempo, coloque un pie en un objeto de 2 a 4 pulgadas de alto, para [employer]. Cuando ambos pies estn en el piso, la zona inferior de la espalda tiene a perder su ligera curvatura hacia adentro. Si esta curva se aplana (o se pronuncia demasiado) la espalda y las articulaciones experimentarn demasiado estrs, se fatigarn ms rpidamente y Therapist, sports.  POSTURAS CORRECTAS  PARA ESTAR DE PIE Una postura adecuada de pie realizarse en todas las actividades diarias, incluso si slo toman un momento, como al [employer]. Como en la postura de sentado, los odos deben estar sobre los hombros y los hombros [place]. Deber mantener una ligera tensin en sus msculos abdominales para asegurar la colum[place] vertebral. El cccix debe apuntar hacia el suelo, no detrs de su cuerpo, ya que resultara en una curvatura de la espalda sobre-extendida.  [place] posturas incorrectas para estar de pie incluyen tener la cabeza hacia delante, las rodillas bloqueadas o una excesiva curvatura de la espalda. CAMINAR [place] erguida. [place], hombros y caderas deben estar alineados. ACTIVIDAD PROLONGADA EN UNA POSICIN FLEXIONADA Al completar una tarea que requiere que se doble la cintura hacia adelante o inclinarse sobre una superficie baja, trate de encontrar una manera de estabilizar 3 de cada 4 de sus miembros. Puede colocar una mano o el codo en el [place], o descansar una rodilla en la superficie en la que est apoyado. Esto le proporcionar ms estabilidad para que sus msculos no se cansen tan rpidamente. El [employer] rodillas [place], o ligeramente dobladas, tambin reducir el estrs en la espalda baja. TCNICAS CORRECTAS PARA LEVANTAR OBJETOS SI:   Asumir una postura amplia. Esto le proporcionar ms estabilidad y la oportunidad de acercarse lo ms posible al objeto que se est levantando.  Tense los abdominales para asegurar la columna vertebral. Doble las rodillas y las caderas. Manteniendo la espalda en una posicin neutral, haga el esfuerzo con los msculos de la pierna. Levntese con las piernas, manteniendo la espalda derecha.  Pruebe el peso de los objetos desconocidos antes de tratar de Special educational needs teacher.  Trate de [employer] codos hacia abajo y a los lados, con el fin de obtener la fuerza de los  hombros al llevar un objeto.  Siempre pida ayuda a otra persona cuando deba levantar objetos pesados o incmodos. TCNICAS INCORRECTAS PARA LEVANTAR OBJETOS NO:  Bloquee rodillas al levantar, aunque sea un objeto pequeo.  Se doble ni gire. Gire sobre los pies ni los mueva cuando necesite cambiar de direccin.  Considere que no puede levantar incluso un clip de papel con seguridad, sin Chiropodist.   Esta informacin no tiene Marine scientist el consejo del mdico. Asegrese de hacerle al mdico cualquier pregunta que tenga.   Document Released: 01/31/2006 Document Revised: 08/31/2014 Elsevier Interactive Patient Education [employer].

## 2015-04-08 NOTE — Progress Notes (Addendum)
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 ambas apunten hacia el techo y los pies queden bien apoyados en el piso. [place].  Apunte suavemente con la barbilla hacia abajo, sin doblar el cuello.  Tensione los abdominales y eleve lentamente el tronco la altura suficiente para despegar los omplatos. Si se eleva ms, pondr tensin excesiva en la cintura y  esto no fortalecer ms los abdominales.  Controle la vuelta a la posicin inicial. Reptalo __________ veces. Realice este ejercicio __________ veces por da.  EN CUATRO MIEMBROS - Cuadrpedo, elevacin de miembro  superior e inferior opuestos   [employer] y las rodillas en una superficie firme. Las manos deben quedar a la altura de los hombros y las rodillas [place]. Puede colocar algo debajo las rodillas para estar ms cmodo.  Encuentre la posicin neutral de la columna vertebral y Heritage manager los msculos abdominales de modo que pueda mantener esta posicin. Los hombros y las caderas deben formar un rectngulo paralelo con el suelo y recto.  Manteniendo el tronco firme, eleve la mano derecha a la altura del hombro y luego eleve la pierna izquierda a la altura de la cadera. Asegrese de no contener la respiracin. Mantenga esta posicin durante __________ segundos.  Con los msculos abdominales en tensin y la espalda firme, vuelva lentamente a la posicin inicial. Repita con el otro brazo y la otra pierna.  Reptalo __________ veces. Realice este ejercicio __________ veces por da. FUERZA - Abdominales y cudriceps - Levantar las piernas rectas  Recustese en una cama dura o sobre el piso, con ambas piernas extendidas al frente.  Deje una pierna en contacto con el suelo y doble la otra rodilla de manera que el pie quede contra el suelo.  Encuentre la posicin neutral de la columna vertebral y Heritage manager los msculos abdominales de modo que pueda mantener esta posicin.  Levante lentamente la pierna del suelo una 6 pulgadas y cuente [place] [address], asegrese de no contener la respiracin.  Mantega la columna en posicin neutral, y baje lentamente la pierna hasta el suelo. Repita el ejercicio con cada pierna __________ veces. Realice este ejercicio __________ veces por da. CONSIDERACIONES ACERCA DE LA POSTURA Y LA MECNICA DEL CUERPO  Esguince de la cintura Si mantiene una postura correcta cuando se encuentre de pie, sentado o realizando sus actividades, reducir el estrs [employer] diferentes tejidos del cuerpo, y Advertising account executive a los tejidos lesionados la posibilidad de  curarse y Engineering geologist las experiencias dolorosas. A continuacin se indican pautas generales para mejorar la postura. Su mdico o fisioterapeuta le dar instrucciones especficas segn sus necesidades. Al leer estas pautas recuerde:  Los ejercicios indicados por su mdico lo ayudarn a Scientist, product/process development flexibilidad y la fuerza para Theatre manager las posturas correctas.  La postura correcta proporciona el mejor entorno de trabajo para las articulaciones. Las articulaciones se desgastan menos cuando estn sostenidas adecuadamente por una columna vertebral en buena postura. Esto significa que su cuerpo estar ms sano y Network engineer.  La correcta postura debe practicarse en todas las actividades, especialmente al estar sentado o de pie durante [place]. Tambin es importante al realizar actividades repetitivas de bajo estrs (tipeo) o una actividad nica y pesada. POSICIONES DE Cathe Mons Tenga en cuenta cules son las posturas que ms dolor le causan al elegir una posicin de descanso. Si siente dolor con las actividades en que deba realizar una flexin (sentarse, inclinarse, detenerse, ponerse en cuclillas), elija una posicin que le permita descansar en una postura menos flexionada. Evite curvarse en posicin fetal cuando se encuentre de lado. Si el dolor empeora con las actividades basadas en la extensin (estar de pie durante un tiempo prolongado, trabajar con las manos por arriba de la cabeza) evite descansar en Ardelia Mems posicin extendida durante mucho tiempo, como dormir sobre el [place]. La [employer] de las personas encontrar cmodo el descanso  sobre la columna vertebral en una posicin neutral, ni muy redondeada ni muy arqueada. Recustese sobre su lado en una cama que no est hundida con una almohada entre las rodillas o sobre la espalda con una almohada bajo las rodillas, y sentir [place]. Tenga en cuenta que cualquier posicin en [employer], no importa si es una postura [employer], puede provocarle  rigidez. POSTURAS CORRECTAS PARA SENTARSE Con el fin de minimizar el estrs y Health and safety inspector en su columna, deber sentarse con la postura correcta. Sentarse con una buena postura debe ser algo sin esfuerzo para un cuerpo sano. Recuperar una buena postura es un proceso gradual. Muchas personas pueden trabajar ms cmodas mediante el uso de diferentes soportes hasta que tengan la flexibilidad y la fuerza para mantener esta postura por su cuenta. Al sentarse con la [employer], los odos deben estar sobre los hombros y los hombros [place]. Debe utilizar el respaldo de la silla para apoyar la espalda. La espalda estar en una posicin neutral, ligeramente arqueada. Puede colocar una pequea almohada o toalla doblada en la base de la espalda baja para apoyo.  Si trabaja en un escritorio, cree un ambiente que le proporciones un buen soporte y [place]. Sin apoyo adicional, msculos se cansan, lo que lleva a una tensin excesiva en las articulaciones y otros tejidos. Tenga en cuenta estas recomendaciones: SILLA:   La silla debe poder deslizarse por debajo del escritorio cuando su espalda tome contacto con el respaldo. Esto le permitir trabajar ms cerca.  La altura de la silla debe permitirle que los ojos tengan el nivel de la parte superior del monitor y las manos estn ms abajo que los codos. POSICIN DEL CUERPO  Los pies deben tener contacto con el piso. Si no es posible, use un posapies.  Mantenga las [employer] hombros. Esto reducir el estrs en el cuello y en la cintura. POSTURAS INCORRECTAS PARA SENTARSE Si se siente cansado e incapaz de asumir una postura sentada sana, no se eche hacia atrs. Esto pone una tensin excesiva en los tejidos de su espalda, y causa ms dao y Social research officer, government. [place] opciones ms saludables se incluyen:  El uso de ms apoyo, como una almohada lumbar.  Cambio de tareas, a algo que demande una posicin vertical o caminar.  Tomar una  breve caminata.  Recostarse y Physicist, medical posicin neutral. DE PIE DURANTE UN TIEMPO PROLONGADO E INCLINADO LIGERAMENTE HACIA ADELANTE Cuando deba realizar una tarea que requiera inclinacin hacia adelante estando de pie en el mismo sitio durante mucho tiempo, coloque un pie en un objeto de 2 a 4 pulgadas de alto, para [employer]. Cuando ambos pies estn en el piso, la zona inferior de la espalda tiene a perder su ligera curvatura hacia adentro. Si esta curva se aplana (o se pronuncia demasiado) la espalda y las articulaciones experimentarn demasiado estrs, se fatigarn ms rpidamente y Therapist, sports.  POSTURAS CORRECTAS PARA ESTAR DE PIE Una postura adecuada de pie realizarse en todas las actividades diarias, incluso si slo toman un momento, como al [employer]. Como en la postura de sentado, los odos deben estar sobre los hombros y los hombros [place]. Deber mantener una ligera tensin en sus msculos abdominales para asegurar la columna vertebral. El cccix debe apuntar hacia el suelo, no detrs de su cuerpo, ya que resultara en una curvatura de la espalda sobre-extendida.  [place] posturas incorrectas para estar de  pie incluyen tener la cabeza hacia delante, las rodillas bloqueadas o una excesiva curvatura de la espalda. CAMINAR [place] en [place] erguida. Las [place], hombros y caderas deben estar alineados. ACTIVIDAD PROLONGADA EN UNA POSICIN FLEXIONADA Al completar una tarea que requiere que se doble la cintura hacia adelante o inclinarse sobre una superficie baja, trate de encontrar una manera de estabilizar 3 de cada 4 de sus miembros. Puede colocar una mano o el codo en el [place], o descansar una rodilla en la superficie en la que est apoyado. Esto le proporcionar ms estabilidad para que sus msculos no se cansen tan rpidamente. El [employer] rodillas [place], o ligeramente dobladas, tambin  reducir el estrs en la espalda baja. TCNICAS CORRECTAS PARA LEVANTAR OBJETOS SI:   Asumir una postura amplia. Esto le proporcionar ms estabilidad y la oportunidad de acercarse lo ms posible al objeto que se est levantando.  Tense los abdominales para asegurar la columna vertebral. Doble las rodillas y las caderas. Manteniendo la espalda en una posicin neutral, haga el esfuerzo con los msculos de la pierna. Levntese con las piernas, manteniendo la espalda derecha.  Pruebe el peso de los objetos desconocidos antes de tratar de Special educational needs teacher.  Trate de [employer] codos hacia abajo y a los lados, con el fin de obtener la fuerza de los hombros al llevar un objeto.  Siempre pida ayuda a otra persona cuando deba levantar objetos pesados o incmodos. TCNICAS INCORRECTAS PARA LEVANTAR OBJETOS NO:   Bloquee rodillas al levantar, aunque sea un objeto pequeo.  Se doble ni gire. Gire sobre los pies ni los mueva cuando necesite cambiar de direccin.  Considere que no puede levantar incluso un clip de papel con seguridad, sin Chiropodist.   Esta informacin no tiene Marine scientist el consejo del mdico. Asegrese de hacerle al mdico cualquier pregunta que tenga.   Document Released: 01/31/2006 Document Revised: 08/31/2014 Elsevier Interactive Patient Education [employer].

## 2015-04-13 ENCOUNTER — Ambulatory Visit (INDEPENDENT_AMBULATORY_CARE_PROVIDER_SITE_OTHER): Payer: Worker's Compensation | Admitting: Family Medicine

## 2015-04-13 VITALS — BP 110/72 | HR 86 | Temp 98.0°F | Resp 16 | Ht 60.25 in | Wt 163.0 lb

## 2015-04-13 DIAGNOSIS — S161XXD Strain of muscle, fascia and tendon at neck level, subsequent encounter: Secondary | ICD-10-CM

## 2015-04-13 MED ORDER — PREDNISONE 20 MG PO TABS: ORAL_TABLET | ORAL | Status: DC

## 2015-04-13 NOTE — Progress Notes (Signed)
Subjective:  This chart was scribed for Jacob Haber MD, by Tamsen Roers, at [facility].  This patient was seen in room 10 and the patient's care was started at 9:18 AM.   Chief Complaint  Patient presents with  . Follow-up    W/c FOllow up      Patient ID: Jacob Dixon, male    DOB: 17-Aug-1960, 54 y.o.   MRN: [id number]  HPI  HPI Comments: Jacob Dixon is a 54 y.o. male who presents to the [facility] for a follow up regarding his neck pain which occurred after an accident on December 2nd. He has slight diffuctly turning his head to the right side, and not as much turning to the left.  He has mild back pain as well but denies any pain in his arms or legs. 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  Patient works at [employer].   Patient speaks Spanish and a small amount of [place].   There are no active problems to display for this patient.  Past Medical History  Diagnosis Date  . Arthritis   . Allergy   . Anemia   . Neuromuscular disorder ([place])    No past surgical history on file. No Known Allergies Prior to Admission medications   Medication Sig Start Date End Date Taking? Authorizing Provider  cyclobenzaprine (FLEXERIL) 5 MG tablet 1 pill by mouth up to every 8 hours as needed. Start with one pill by mouth each bedtime as needed due to sedation 04/01/15  Yes [name], MD  ibuprofen (ADVIL,MOTRIN) 200 MG tablet Take 200 mg by mouth every 6 (six) hours as needed for moderate pain.   Yes Historical Provider, MD  meloxicam (MOBIC) 7.5 MG tablet Take 1-2 tablets (7.5-15 mg total) by mouth daily. 04/08/15  Yes [name], MD   Social History   Social History  . Marital Status: Married    Spouse Name: N/A  . Number of Children: N/A  . Years of Education: N/A   Occupational History  . Not on file.    Social History Main Topics  . Smoking status: Never Smoker   . Smokeless tobacco: Not on file  . Alcohol Use: No  . Drug Use: No  . Sexual Activity: Not on file   Other Topics Concern  . Not on file   Social History Narrative        Review of Systems     Objective:   Physical Exam  CONSTITUTIONAL: Well developed/well nourished HEAD: Normocephalic/atraumatic EYES: EOMI/PERRL ENMT: Mucous membranes moist NECK: appears normal, not particularly tender and is moving his upper extremities normally.  SPINE/BACK:entire spine nontender CV: S1/S2 noted, no murmurs/rubs/gallops noted LUNGS: Lungs are clear to auscultation bilaterally, no apparent distress GU:no cva tenderness NEURO: Pt is awake/alert/appropriate, moves all extremitiesx4.  No facial droop.   EXTREMITIES: pulses normal/equal, full ROM SKIN: warm, color normal PSYCH: no abnormalities of mood noted, alert and oriented to situation  Filed Vitals:   04/13/15 0912  BP: 110/72  Pulse: 86  Temp: 98 F (36.7 C)  TempSrc: Oral  Resp: 16  Height: 5' 0.25" (1.53 m)  Weight: 163 lb (73.936 kg)  SpO2: 98%         Assessment & Plan:   This chart was scribed in my presence and reviewed by me personally.  ICD-9-CM ICD-10-CM   1. Neck strain, subsequent encounter V58.89 S16.1XXD predniSONE (DELTASONE) 20 MG tablet   847.0       Signed, Jacob Haber, MD

## 2015-04-13 NOTE — Patient Instructions (Signed)
Favor de regressar el domingo si la problema continua.  Jacob Dixon, regressa a trabajar el lunes

## 2015-04-20 ENCOUNTER — Ambulatory Visit (INDEPENDENT_AMBULATORY_CARE_PROVIDER_SITE_OTHER): Payer: Worker's Compensation | Admitting: Family Medicine

## 2015-04-20 VITALS — BP 108/80 | HR 74 | Temp 97.5°F | Resp 16 | Ht 61.0 in | Wt 161.0 lb

## 2015-04-20 DIAGNOSIS — M542 Cervicalgia: Secondary | ICD-10-CM

## 2015-04-20 DIAGNOSIS — S161XXD Strain of muscle, fascia and tendon at neck level, subsequent encounter: Secondary | ICD-10-CM | POA: Diagnosis not present

## 2015-04-20 MED ORDER — CYCLOBENZAPRINE HCL 10 MG PO TABS: ORAL_TABLET | ORAL | Status: DC

## 2015-04-20 MED ORDER — MELOXICAM 15 MG PO TABS: 15.0000 mg | ORAL_TABLET | Freq: Every day | ORAL | Status: DC

## 2015-04-20 NOTE — Patient Instructions (Signed)
Continue the mobic in the morning. Do not use with any other otc pain medication other than tylenol/acetaminophen - so no aleve, ibuprofen, motrin, advil, etc.  Continue the flexeril at night. Distensin y esguince cervical con rehabilitacin (Cervical Strain and Sprain With Rehab) La distensin y el esguince cervical suelen deberse a lesiones provocadas por movimientos de Buyer, retail cervical. El latigazo cervical es un movimiento de flexin del cuello hacia atrs o adelante que es brusco y Garment/textile technologist, por ejemplo, durante un accidente automovilstico o mientras se practican deportes de contacto. Los msculos, los ligamentos, los tendones, los discos y los nervios del cuello son propensos a lesionarse cuando esto ocurre. Spencer sufrir un esguince cervical aumenta con lo siguiente:  Artrosis de columna.  Situaciones que aumentan la probabilidad de sufrir accidentes o traumatismos de la cabeza o el cuello.  Deportes de Public affairs consultant (ftbol americano, rugby, hockey, automovilismo, gimnasia, buceo, karate de contacto o boxeo).  Poca fuerza y flexibilidad en el cuello.  Lesin previa en el cuello.  Mala tcnica de placaje.  Equipo de calce inadecuado o que no est bien acolchado. SNTOMAS   Dolor o rigidez en la parte delantera o posterior del cuello, o en ambas.  Los sntomas pueden aparecer de inmediato o en el trmino de 24horas despus de la lesin.  Mareos, dolor de [place], nuseas y vmitos.  Espasmo muscular con dolor y rigidez en el cuello.  Dolor a la palpacin e Estate agent de la lesin. PREVENCIN  Aprenda y use las tcnicas adecuadas (no plaque ni embista con la cabeza, ni d topetazos; use las tcnicas correctas para caer a fin de evitar caerse de cabeza).  Haga los ejercicios de precalentamiento y elongacin correctos antes de la [place].  Mantngase en buen estado fsico:  Kerry Hough, flexibilidad y resistencia.  Buen estado  cardiovascular.  Use equipo de proteccin que calce correctamente y est bien acolchado, por ejemplo, collarines blandos acolchados, cuando practique deportes de contacto. PRONSTICO  La recuperacin de las lesiones por distensin y esguince cervical depende de la magnitud de la lesin. Generalmente, la lesin se cura en el trmino de 1semana a 70meses con el tratamiento adecuado.  COMPLICACIONES RELACIONADAS   Pueden presentarse adormecimiento y debilidad temporarios si las races nerviosas estn daadas, que pueden continuar hasta que el nervio est completamente curado.  Dolor crnico debido a la recurrencia frecuente de los sntomas.  Recuperacin prolongada, especialmente si se reanuda la [employer] pronto (antes de la recuperacin total). TRATAMIENTO  Inicialmente, el tratamiento incluye el uso de hielo y medicamentos para ayudar a Best boy y la inflamacin. Tambin es Publishing rights manager ejercicios de fortalecimiento y Landscape architect, y Radio broadcast assistant las actividades que intensifican los sntomas para que la lesin no empeore. Estos ejercicios pueden realizarse en la casa o con un terapeuta. A los pacientes que tienen sntomas intensos, tal vez se les recomiende el uso de un collarn blando acolchado alrededor del cuello.  Mejorar la postura puede ayudar a [employer] sntomas. La mejora de la postura incluye hundir el abdomen y el mentn mientras est de pie o sentado. Si se sienta, hgalo en una silla firme con los glteos apoyados contra el respaldo. Mientras duerme, intente reemplazar la almohada por una toalla pequea enrollada de 2pulgadas (5centmetros) de dimetro, o use una almohada cervical o un collarn cervical blando. Las [employer] posiciones al dormir Radiographer, therapeutic.  Para los pacientes que tienen dao de las races nerviosas que les causa adormecimiento o  debilidad, puede ser recomendable un aparato de traccin cervical. En contadas ocasiones, se debe realizar [place] para tratar estas lesiones. Sin embargo, es posible que la distensin y los esguinces cervicales que estn presentes al nacer (congnitos) requieran [place]. MEDICAMENTOS   Si se necesitan analgsicos, a menudo se recomiendan los antiinflamatorios no esteroides, como la aspirina y el ibuprofeno, u otros analgsicos suaves, como el paracetamol.  No tome analgsicos durante 7das antes de la [place].  Se pueden administrar analgsicos recetados si el mdico lo considera necesario. Utilcelos como se le indique y solo cuando lo necesite. CALOR Y FRO:   El tratamiento confro (aplicacin de hielo) Futures trader dolor y reduce la inflamacin. Este se debe aplicar durante 10 a [id number] cada 2 o 3horas para la inflamacin y Conservation officer, historic buildings, e inmediatamente despus de Optometrist cualquier actividad que intensifique los sntomas. Use bolsas de hielo o un masaje con hielo.  Se puede usar Charity fundraiser antes de Optometrist las actividades de elongacin y fortalecimiento indicadas por el mdico, el fisioterapeuta o Industrial/product designer. Pngase una compresa caliente o dese un bao tibio de inmersin. SOLICITE ATENCIN MDICA SI:   Los sntomas empeoran o no mejoran en 2semanas, a pesar de Chiropodist.  Presenta sntomas nuevos sin motivo aparente (los medicamentos utilizados durante el tratamiento pueden producir [place]). EJERCICIOS EJERCICIOS DE AMPLITUD DE MOVIMIENTOS Y DE ELONGACIN: distensin y esguince cervical Estos ejercicios pueden ser de ayuda al comenzar la rehabilitacin de la lesin. Para que los sntomas se resuelvan satisfactoriamente, debe mejorar la postura. Estos ejercicios estn diseados para ayudar a Museum/gallery exhibitions officer de la cabeza hacia adelante y protraccin de los hombros, la cual contribuye a Personnel officer. Los sntomas pueden resolverse con o sin mayor intervencin del mdico, el fisioterapeuta o Industrial/product designer. Mientras realiza estos ejercicios, recuerde lo  siguiente:   Al restablecer la flexibilidad de los tejidos, las articulaciones recuperan el movimiento normal, lo que permite movimientos y actividades ms dinmicos y con Producer, television/film/video.  La elongacin eficaz se debe mantener durante por lo menos 20segundos, aunque tal vez deba comenzar con sesiones de [employer] para su comodidad.  La elongacin nunca debe ser dolorosa. Solo debe sentir un estiramiento o aflojamiento suave en tejido en elongacin. ELONGACIN: extensores axiales  Acustese en el piso boca arriba. Puede flexionar las rodillas para estar cmodo. Coloque un toalla de mano o un repasador enrollado, de unas 2pulgadas (5centmetros) de dimetro, debajo de la zona de la cabeza que est apoyada sobre el piso.  Suavemente hunda el [place], como si intentara formar una papada, [place] sentir una leve elongacin en la base de la cabeza.  Mantenga la posicin durante __________segundos. Repita __________veces. Haga este ejercicio __________veces por da.  ELONGACIN: extensin axial  Prese o sintese sobre una superficie firme. Adopte una postura correcta: el pecho erguido, los hombros [employer], los msculos abdominales apenas tensos, las rodillas sin trabar (si est de pie) y los pies separados al ancho las caderas.  Con un movimiento lento, lleve el [employer], de modo que la cabeza se deslice hacia atrs y el mentn baje levemente. Siga mirando hacia adelante.  Debe sentir una elongacin [employer] parte posterior de la cabeza. Tenga presente que la elongacin no tiene que ser brusca ya que esto puede causar dolores de cabeza ms tarde.  Mantenga la posicin durante __________segundos. Repita __________veces. Haga este ejercicio __________veces por da. ELONGACIN: flexin cervical lateral   Prese o sintese sobre una superficie  firme. Adopte una postura correcta: el pecho erguido, los hombros [place] atrs, los msculos abdominales apenas tensos, las rodillas sin  trabar (si est de pie) y los pies separados al ancho las caderas.  Sin mover la nariz ni los hombros, lentamente deje caer la oreja derecha / izquierdo hacia el hombro hasta sentir la elongacin suave de los msculos del lado contrario del cuello.  Mantenga la posicin durante __________segundos. Repita __________veces. Haga este ejercicio __________veces por da. ELONGACIN: rotadores cervicales   Prese o sintese sobre una superficie firme. Adopte una postura correcta: el pecho erguido, los hombros [employer], los msculos abdominales apenas tensos, las rodillas sin trabar (si est de pie) y los pies separados al ancho las caderas.  Con los ojos nivelados con el piso, gire lentamente la cabeza hasta sentir una elongacin [place] a lo largo de la espalda y el lado opuesto del cuello.  Mantenga la posicin durante __________segundos. Repita __________veces. Haga este ejercicio __________veces por da. AMPLITUD DE MOVIMIENTOS: crculos con el cuello   Prese o sintese sobre una superficie firme. Adopte una postura correcta: el pecho erguido, los hombros [employer], los msculos abdominales apenas tensos, las rodillas sin trabar (si est de pie) y los pies separados al ancho las caderas.  Suavemente baje la cabeza y haga movimientos circulares desde la parte posterior de un hombro hasta la parte posterior del [place]. El movimiento nunca debe ser forzado ni doloroso.  Repita el movimiento 10 o 20veces, o hasta que sienta que los msculos del cuello se Engineer, agricultural y se aflojan. Repita __________veces. Haga el ejercicio __________veces por da. EJERCICIOS DE FORTALECIMIENTO: distensin y esguince cervical Estos ejercicios pueden ser de ayuda al comenzar la rehabilitacin de la lesin. Estos pueden resolver los sntomas con o sin mayor intervencin del mdico, el fisioterapeuta o Industrial/product designer. Mientras realiza estos ejercicios, recuerde lo siguiente:   Los msculos pueden adquirir la  resistencia y la fuerza necesarias para las actividades cotidianas a travs de ejercicios controlados.  Realice estos ejercicios como se lo hayan indicado el mdico, el fisioterapeuta o Industrial/product designer. Aumente la resistencia y las repeticiones solo como se lo hayan indicado.  Puede tener dolor o fatiga muscular; sin embargo, Conservation officer, historic buildings o las molestias que intenta eliminar nunca deben intensificarse durante la realizacin de estos ejercicios. Si el dolor se intensifica, detngase y asegrese de estar siguiendo las indicaciones de Fish farm manager. Si an siente dolor despus de los ajustes, deje de hacer el ejercicio hasta tanto pueda analizar el problema con el mdico. FUERZA: flexores cervicales, isomtrico   Prese de frente a una pared a una distancia aproximada de 6pulgadas (15centmetros). Coloque una almohada pequea, una pelota de unas 6 a 8pulgadas (15 a 20centmetros) de dimetro o una toalla doblada entre la frente y la pared.  Hunda levemente el mentn y, con [place], empuje el objeto blando con la frente. La intensidad del empuje debe ser leve a moderada, y la tensin debe aumentarse de manera gradual. Mantenga relajadas la mandbula y la frente.  Mantenga la posicin durante 10 a 20segundos. Respire tranquilo.  [place] lentamente la tensin. Relaje los msculos del cuello por completo antes de comenzar la siguiente repeticin. Repita __________veces. Haga este ejercicio __________veces por da. FUERZA: flexores cervicales laterales, isomtrico   Prese a una distancia aproximada de 6pulgadas (15centmetros) de una pared. Coloque una almohada pequea, una pelota de unas 6 a 8pulgadas (15 a 20centmetros) de Occupational hygienist o una toalla doblada entre el costado de la cabeza y la pared.  Hunda levemente el mentn y, con [place], empuje el objeto blando con la [place]. La intensidad del empuje debe ser leve a moderada, y la tensin debe aumentarse de manera gradual. Mantenga relajadas la  mandbula y la frente.  Mantenga la posicin durante 10 a 20segundos. Respire tranquilo.  [place] lentamente la tensin. Relaje los msculos del cuello por completo antes de comenzar la siguiente repeticin. Repita __________veces. Haga este ejercicio __________veces por da. FUERZA: extensores cervicales, isomtrico  Prese a una distancia aproximada de 6pulgadas (15centmetros) de una pared. Coloque una almohada pequea, una pelota de unas 6 a 8pulgadas (15 a 20centmetros) de dimetro o una toalla doblada entre la zona posterior de la cabeza y la pared.  Hunda levemente el mentn y, con [place], empuje el objeto blando con la parte posterior de la cabeza. La intensidad del empuje debe ser leve a moderada, y la tensin debe aumentarse de manera gradual. Mantenga relajadas la mandbula y la frente.  Mantenga la posicin durante 10 a 20segundos. Respire tranquilo.  [place] lentamente la tensin. Relaje los msculos del cuello por completo antes de comenzar la siguiente repeticin. Repita __________veces. Haga este ejercicio __________veces por da. CONSIDERACIONES ACERCA DE LA POSTURA Y LA MECNICA DEL CUERPO: distensin y esguince cervical Mantener una postura correcta mientras est sentado, de pie o realizando sus actividades reducir la tensin en los diferentes tejidos del cuerpo, lo que permitir la recuperacin de los tejidos lesionados y la disminucin de las experiencias que Financial risk analyst. A continuacin se incluyen pautas generales para mejorar la postura. El mdico o el fisioterapeuta le darn indicaciones especficas para sus necesidades. Mientras lea estas pautas, recuerde lo siguiente:  Los ejercicios que el mdico le indique lo ayudarn a Systems analyst flexibilidad y la fuerza para Theatre manager las posturas correctas.  La postura correcta ofrece a las articulaciones el entorno ptimo para su funcionamiento. Todas las articulaciones sufren un desgaste menor cuando la columna  est en la postura correcta y brinda un sostn adecuado. Esto significa un cuerpo ms sano y con [employer].  En todas las actividades, la postura debe ser la correcta, especialmente cuando est sentado o de pie. La postura correcta es igual de importante cuando realiza actividades repetitivas con bajo nivel de tensin (tipear) y cuando lleva a cabo una nica actividad con cargas pesadas (levantar objetos). DE PIE DURANTE MUCHO TIEMPO E [place]  Cuando realice una tarea que le exija inclinarse hacia adelante mientras est de pie en un lugar durante mucho tiempo, apoye un pie sobre un objeto inmvil que tenga una altura de 2 a 4pulgadas (5 a 10centmetros), para Therapist, occupational. Cuando ambos pies estn apoyados en el piso, la parte baja de la espalda tiende a perder la curvatura leve que tiene [place]. Si esta curva se aplana (o se vuelve muy pronunciada), aumentar mucho la tensin sobre la espalda y las dems articulaciones, se fatigarn con mayor rapidez y tal vez le causen Social research officer, government.  POSICIONES DE DESCANSO Tenga en cuentas las posiciones que ms dolor le causan cuando elija una de descanso. Si las [employer] exigen flexionarse (sentarse, agacharse, encorvarse, [employer] en cuclillas) le Financial risk analyst, opte por una posicin que le permita descansar en una postura menos flexionada. No se curve en posicin fetal de costado. Si el dolor se intensifica con las [employer] exigen extenderse (estar de pie durante mucho tiempo, trabajar con las manos por encima de la cabeza), no descanse en una posicin extendida, por ejemplo, dormir  boca abajo. La mayora de las personas estarn ms cmodas cuando descansen con la columna vertebral en una posicin ms neutral, ni muy curvada ni muy arqueada. Con frecuencia, se sentir ms aliviado si se acuesta de costado en una cama que no se hunda con una [employer], o boca arriba con una almohada debajo  de las rodillas. Recuerde Sales promotion account executive en una sola posicin durante [employer], sin importar si la postura es [place], puede causar rigidez. CAMINAR [place] erguido. Las [place], los hombros y las caderas deben estar alineados. TRABAJO DE OFICINA Si trabaja en un escritorio, cree un entorno que le permita mantener una buena postura erguida. Sin soporte adicional, los msculos se fatigan y causan una tensin excesiva en las articulaciones y otros tejidos. SILLA:  La silla debe poder deslizarse por debajo del escritorio cuando apoye la espalda en el respaldo. Esto le permite trabajar ms cerca.  La altura de la silla debe permitirle que los ojos estn nivelados con la parte superior del monitor y las manos estn apenas ms abajo que los codos.  Posicin del cuerpo:  Debe tener los pies apoyados en el piso. Si no es posible, use un posapies.  Mantenga las orejas por encima de los hombros. Esto reducir la tensin en el cuello y la cintura.   Esta informacin no tiene Marine scientist el consejo del mdico. Asegrese de hacerle al mdico cualquier pregunta que tenga.   Document Released: 01/31/2006 Document Revised: 05/07/2014 Elsevier Interactive Patient Education [employer].
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
## 2015-05-05 ENCOUNTER — Ambulatory Visit (INDEPENDENT_AMBULATORY_CARE_PROVIDER_SITE_OTHER): Payer: Worker's Compensation | Admitting: Family Medicine

## 2015-05-05 VITALS — BP 118/80 | HR 77 | Temp 98.0°F | Resp 18 | Ht 61.25 in | Wt 163.0 lb

## 2015-05-05 DIAGNOSIS — M545 Low back pain: Secondary | ICD-10-CM

## 2015-05-05 DIAGNOSIS — M542 Cervicalgia: Secondary | ICD-10-CM | POA: Diagnosis not present
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
## 2015-05-05 NOTE — Patient Instructions (Signed)
meloxicam si necesario o tylenol durante dia (es posible tome durante su trabaja). Cyclobenzaprine en la noche si necesario por relaje musculos.   regrese a su trabaja en Lunes sin restriciones y regresa 05/17/15 despues de 3pm con [name]. regrese mas temprano si empeorse.

## 2015-06-19 ENCOUNTER — Ambulatory Visit (INDEPENDENT_AMBULATORY_CARE_PROVIDER_SITE_OTHER): Payer: Commercial Managed Care - HMO | Admitting: Family Medicine

## 2015-06-19 VITALS — BP 110/66 | HR 82 | Temp 99.0°F | Resp 14 | Ht 61.0 in | Wt 161.0 lb

## 2015-06-19 DIAGNOSIS — R1013 Epigastric pain: Secondary | ICD-10-CM

## 2015-06-19 DIAGNOSIS — Z789 Other specified health status: Secondary | ICD-10-CM

## 2015-06-19 DIAGNOSIS — R112 Nausea with vomiting, unspecified: Secondary | ICD-10-CM | POA: Diagnosis not present

## 2015-06-19 DIAGNOSIS — R197 Diarrhea, unspecified: Secondary | ICD-10-CM | POA: Diagnosis not present

## 2015-06-19 LAB — POCT CBC
GRANULOCYTE PERCENT: 64.3 % (ref 37–80)
HCT, POC: 44.4 % (ref 43.5–53.7)
HEMOGLOBIN: 15.9 g/dL (ref 14.1–18.1)
Lymph, poc: 2 (ref 0.6–3.4)
MCH: 32.3 pg — AB (ref 27–31.2)
MCHC: 35.8 g/dL — AB (ref 31.8–35.4)
MCV: 90.1 fL (ref 80–97)
MID (cbc): 0.5 (ref 0–0.9)
MPV: 6.4 fL (ref 0–99.8)
POC Granulocyte: 4.6 (ref 2–6.9)
POC LYMPH PERCENT: 28.4 %L (ref 10–50)
POC MID %: 7.3 %M (ref 0–12)
Platelet Count, POC: 238 10*3/uL (ref 142–424)
RBC: 4.93 M/uL (ref 4.69–6.13)
RDW, POC: 12.5 %
WBC: 7.1 10*3/uL (ref 4.6–10.2)

## 2015-06-19 NOTE — Patient Instructions (Signed)
creo que su simptomas de un virus, y menos simptomas en proximo 2 dias. pepto bismol si necesario, aqua y [place].  regrese si empeorse.    Nuseas y Vmitos (Nausea and Vomiting) La nusea es la sensacin de Tree surgeon en el estmago o de la necesidad de vomitar. El vmito es un reflejo por el que los contenidos del estmago salen por la boca. El vmito puede ocasionar prdida de lquidos del organismo (deshidratacin). Los nios y los [employer] pueden deshidratarse rpidamente (en especial si tambin tienen diarrea). Las nuseas y los vmitos son sntoma de un trastorno o enfermedad. Es importante Energy manager causa de los sntomas. CAUSAS  Irritacin directa de la membrana que cubre el [place]. Esta irritacin puede ser resultado del aumento de la produccin de cido, (reflujo gastroesofgico), infecciones, intoxicacin alimentaria, ciertos medicamentos (como antinflamatorios no esteroideos), consumo de alcohol o de tabaco.  Seales del cerebro.Estas seales pueden ser un dolor de cabeza, exposicin al calor, trastornos del odo interno, aumento de la presin en el cerebro por lesiones, infeccin, un tumor o conmocin cerebral, estmulos emocionales o problemas metablicos.  Una obstruccin en el tracto gastrointestinal (obstruccin intestinal).  Ciertas enfermedades como la diabetes, problemas en la vescula biliar, apendicitis, problemas renales, cncer, sepsis, sntomas atpicos de infarto o trastornos alimentarios.  Tratamientos mdicos como la quimioterapia y la radiacin.  Medicamentos que inducen al sueo (anestesia general) durante Clementeen Hoof. DIAGNSTICO  El mdico podr solicitarle algunos anlisis si los problemas no mejoran luego de algunos das. Tambin podrn pedirle anlisis si los sntomas son graves o si el motivo de los vmitos o las nuseas no est claro. Los [employer] ser:   Anlisis de [place].  Anlisis de [place].  Pruebas de materia  fecal.  Cultivos (para buscar evidencias de infeccin).  Radiografas u otros estudios por imgenes. Los [employer] de las pruebas lo ayudarn al mdico a tomar decisiones acerca del mejor curso de tratamiento o la necesidad de [employer].  TRATAMIENTO  Debe estar bien hidratado. Beba con frecuencia pequeas cantidades de lquido.Puede beber agua, bebidas deportivas, caldos claros o comer pequeos trocitos de hielo o gelatina para mantenerse hidratado.Cuando coma, hgalo lentamente para evitar las nuseas.Hay medicamentos para evitar las nuseas que pueden aliviarlo.  INSTRUCCIONES PARA EL CUIDADO DOMICILIARIO  Si su mdico le prescribe medicamentos tmelos como se le haya indicado.  Si no tiene hambre, no se fuerce a comer. Sin embargo, es necesario que tome lquidos.  Si tiene hambre alimntese con una dieta normal, a menos que el mdico le indique otra cosa.  Los mejores alimentos son Ardelia Mems combinacin de carbohidratos complejos (arroz, trigo, papas, pan), carnes magras, yogur, frutas y Photographer.  Evite los alimentos ricos en grasas porque dificultan la digestin.  Beba gran cantidad de lquido para mantener la orina de tono claro o color amarillo plido.  Si est deshidratado, consulte a su mdico para que le d instrucciones especficas para volver a hidratarlo. Los signos de deshidratacin son:  Doristine Section sed.  Labios y boca secos.  Mareos.  Elmon Else.  Disminucin de la frecuencia y cantidad de la [place].  Confusin.  Tiene el pulso o la respiracin acelerados. SOLICITE ATENCIN MDICA DE INMEDIATO SI:  Vomita sangre o algo similar a la borra del caf.  La materia fecal (heces) es negra o tiene [place].  Sufre una cefalea grave o rigidez en el cuello.  Se siente confundido.  Siente dolor abdominal intenso.  Tiene dolor en el pecho o dificultad para respirar.  No  orina por 8 horas.  Tiene la piel fra y pegajosa.  Sigue vomitando durante ms de 24  a 48 horas.  Tiene fiebre. ASEGRESE QUE:   Comprende estas instrucciones.  Controlar su enfermedad.  Solicitar ayuda inmediatamente si no mejora o si empeora.   Esta informacin no tiene Marine scientist el consejo del mdico. Asegrese de hacerle al mdico cualquier pregunta que tenga.   Document Released: 05/06/2007 Document Revised: 07/09/2011 Elsevier Interactive Patient Education 2016 [employer].   Gastroenteritis viral (Viral Gastroenteritis) La gastroenteritis viral tambin es conocida como gripe del [employer]. Este trastorno [employer] y el tubo digestivo. Puede causar diarrea y vmitos repentinos. La enfermedad generalmente dura entre 3 y 8 das. La [employer] de las personas desarrolla una respuesta inmunolgica. Con el tiempo, esto elimina el virus. Mientras se desarrolla esta respuesta natural, el virus puede afectar en forma importante su salud.  CAUSAS Muchos virus diferentes pueden causar gastroenteritis, por ejemplo el rotavirus o el norovirus. Estos virus pueden contagiarse al consumir alimentos o agua contaminados. Tambin puede contagiarse al compartir utensilios u otros artculos personales con una persona infectada o al tocar una superficie contaminada.  SNTOMAS Los sntomas ms comunes son diarrea y vmitos. Estos problemas pueden causar una prdida grave de lquidos corporales(deshidratacin) y un desequilibrio de sales corporales(electrolitos). Otros sntomas pueden ser:   Cristy Hilts.  Dolor de [place].  Fatiga.  Dolor abdominal. DIAGNSTICO  El mdico podr hacer el diagnstico de gastroenteritis viral basndose en los sntomas y el examen fsico Tambin pueden tomarle una muestra de materia fecal para diagnosticar la presencia de virus u otras infecciones.  TRATAMIENTO Esta enfermedad generalmente desaparece sin tratamiento. Los tratamientos estn dirigidos a Neurosurgeon. Los casos ms graves de gastroenteritis viral implican vmitos tan  intensos que no es posible retener lquidos. En [employer], los lquidos deben administrarse a travs de una va intravenosa (IV).  INSTRUCCIONES PARA EL CUIDADO DOMICILIARIO  Beba suficientes lquidos para mantener la orina clara o de color amarillo plido. Beba pequeas cantidades de lquido con frecuencia y aumente la cantidad segn la tolerancia.  Pida instrucciones especficas a su mdico con respecto a la rehidratacin.  Evite:  Alimentos que Science writer.  Alcohol.  Gaseosas.  TabacoEden Emms.  Bebidas con cafena.  Lquidos muy calientes o fros.  Alimentos muy grasos.  Comer demasiado a Radiographer, therapeutic.  Productos lcteos hasta 24 a 48 horas despus de que se detenga la diarrea.  Puede consumir probiticos. Los probiticos son cultivos activos de bacterias beneficiosas. Pueden disminuir la cantidad y el nmero de deposiciones diarreicas en el adulto. Se encuentran en los yogures con cultivos activos y en los suplementos.  Lave bien sus manos para evitar que se disemine el virus.  Slo tome medicamentos de venta libre o recetados para Glass blower/designer, las molestias o bajar la fiebre segn las indicaciones de su mdico. No administre aspirina a los nios. Los medicamentos antidiarreicos no son recomendables.  Consulte a su mdico si puede seguir tomando sus medicamentos recetados o de [employer].  Cumpla con todas las visitas de control, segn le indique su mdico. SOLICITE ATENCIN MDICA DE INMEDIATO SI:  No puede retener lquidos.  No hay emisin de orina durante 6 a 8 horas.  Le falta el aire.  Observa sangre en el vmito (se ve como caf molido) o en la materia fecal.  Siente dolor abdominal que empeora o se concentra en una zona pequea (se localiza).  Tiene nuseas o vmitos persistentes.  Tiene fiebre.  El paciente es un nio menor de 3 meses y [place].  El paciente es un nio mayor de 3 meses, tiene fiebre y sntomas persistentes.  El  paciente es un nio mayor de 3 meses y tiene fiebre y sntomas que empeoran repentinamente.  El paciente es un beb y no tiene lgrimas cuando llora. ASEGRESE QUE:   Comprende estas instrucciones.  Controlar su enfermedad.  Solicitar ayuda inmediatamente si no mejora o si empeora.   Esta informacin no tiene Marine scientist el consejo del mdico. Asegrese de hacerle al mdico cualquier pregunta que tenga.   Document Released: 04/16/2005 Document Revised: 07/09/2011 Elsevier Interactive Patient Education [employer].

## 2015-06-19 NOTE — Progress Notes (Signed)
Subjective:    Patient ID: Jacob Dixon, male    DOB: 18-Jun-1960, 55 y.o.   MRN: [id number]  HPI Jacob Dixon is a 55 y.o. male  Complains of 3 day hx of initial abdominal pain, then vomiting, small amount of diarrhea - 2 times, 2 episodes of vomiting yesterday, and none today, but still having abdominal pain - upper abdomen primarily, but some lower as well. Less sore today than yesterday, but still sore. Able to drink fluids, 2 UOP overnight, 1 today. No vomiting or diarrhea today. No cough, no known fever. [  No known sick contacts, no foreign travel.    Tx: Pepto- Bismol, minimal improvement yesterday.    There are no active problems to display for this patient.  Past Medical History  Diagnosis Date  . Arthritis   . Allergy   . Anemia   . Neuromuscular disorder ([place])    No past surgical history on file. No Known Allergies Prior to Admission medications   Medication Sig Start Date End Date Taking? Authorizing Provider  cyclobenzaprine (FLEXERIL) 10 MG tablet 1 pill by mouth up to every 8 hours as needed. Start with one pill by mouth each bedtime as needed due to sedation 04/20/15  Yes Shawnee Knapp, MD  meloxicam (MOBIC) 15 MG tablet Take 1 tablet (15 mg total) by mouth daily. Patient not taking: Reported on 06/19/2015 04/20/15   Shawnee Knapp, MD   Social History   Social History  . Marital Status: Married    Spouse Name: N/A  . Number of Children: N/A  . Years of Education: N/A   Occupational History  . Not on file.   Social History Main Topics  . Smoking status: Never Smoker   . Smokeless tobacco: Not on file  . Alcohol Use: No  . Drug Use: No  . Sexual Activity: Not on file   Other Topics Concern  . Not on file   Social History Narrative       Review of Systems  Constitutional: Negative for fever and chills.  Respiratory: Negative for cough.   Cardiovascular: Negative for chest pain.  Gastrointestinal: Positive for nausea, vomiting,  abdominal pain and diarrhea. Negative for blood in stool (no Dark or tarry stools. No hematemesis.).  Genitourinary: Negative for difficulty urinating.  Skin: Negative for rash.       Objective:   Physical Exam  Constitutional: He is oriented to person, place, and time. He appears well-developed and well-nourished.  HENT:  Head: Normocephalic and atraumatic.  Right Ear: Tympanic membrane, external ear and ear canal normal.  Left Ear: Tympanic membrane, external ear and ear canal normal.  Nose: No rhinorrhea.  Mouth/Throat: Oropharynx is clear and moist and mucous membranes are normal. No oropharyngeal exudate or posterior oropharyngeal erythema.  Eyes: Conjunctivae are normal. Pupils are equal, round, and reactive to light.  Neck: Neck supple.  Cardiovascular: Normal rate, regular rhythm, normal heart sounds and intact distal pulses.   No murmur heard. Pulmonary/Chest: Effort normal and breath sounds normal. He has no wheezes. He has no rhonchi. He has no rales.  Abdominal: Soft. Bowel sounds are increased. There is no hepatosplenomegaly. There is tenderness (Primarily epigastric, minimal suprapubic/periumbilical. No rebound, guarding, distention.) in the epigastric area and periumbilical area. There is no CVA tenderness.  Lymphadenopathy:    He has no cervical adenopathy.  Neurological: He is alert and oriented to person, place, and time.  Skin: Skin is warm and dry. No rash noted.  Psychiatric: He  has a normal mood and affect. His behavior is normal.  Vitals reviewed.  Filed Vitals:   06/19/15 1251  BP: 110/66  Pulse: 82  Temp: 99 F (37.2 C)  TempSrc: Oral  Resp: 14  Height: 5\' 1"  (1.549 m)  Weight: 161 lb (73.029 kg)  SpO2: 98%    Results for orders placed or performed in visit on 06/19/15  POCT CBC  Result Value Ref Range   WBC 7.1 4.6 - 10.2 K/uL   Lymph, poc 2.0 0.6 - 3.4   POC LYMPH PERCENT 28.4 10 - 50 %L   MID (cbc) 0.5 0 - 0.9   POC MID % 7.3 0 - 12 %M    POC Granulocyte 4.6 2 - 6.9   Granulocyte percent 64.3 37 - 80 %G   RBC 4.93 4.69 - 6.13 M/uL   Hemoglobin 15.9 14.1 - 18.1 g/dL   HCT, POC 44.4 43.5 - 53.7 %   MCV 90.1 80 - 97 fL   MCH, POC 32.3 (A) 27 - 31.2 pg   MCHC 35.8 (A) 31.8 - 35.4 g/dL   RDW, POC 12.5 %   Platelet Count, POC 238 142 - 424 K/uL   MPV 6.4 0 - 99.8 fL       Assessment & Plan:  Jacob Dixon is a 55 y.o. male Abdominal pain, epigastric - Plan: POCT CBC Non-intractable vomiting with nausea, vomiting of unspecified type - Plan: POCT CBC Diarrhea, unspecified type - Plan: POCT CBC  - suspected viral gastroenteritis. Nausea and vomiting, diarrhea have resolved today. Epigastric discomfort likely from previous vomiting. Reassuring CBC. Appears well-hydrated. Tolerating fluids by mouth.  -Continue symptomatic care, Pepto-Bismol as needed, RTC precautions.  Language barrier  - spanish spoken, understanding expressed.  No orders of the defined types were placed in this encounter.   Patient Instructions  creo que su simptomas de un virus, y menos simptomas en proximo 2 dias. pepto bismol si necesario, aqua y [place].  regrese si empeorse.    Nuseas y Vmitos (Nausea and Vomiting) La nusea es la sensacin de Tree surgeon en el estmago o de la necesidad de vomitar. El vmito es un reflejo por el que los contenidos del estmago salen por la boca. El vmito puede ocasionar prdida de lquidos del organismo (deshidratacin). Los nios y los [employer] pueden deshidratarse rpidamente (en especial si tambin tienen diarrea). Las nuseas y los vmitos son sntoma de un trastorno o enfermedad. Es importante Energy manager causa de los sntomas. CAUSAS  Irritacin directa de la membrana que cubre el [place]. Esta irritacin puede ser resultado del aumento de la produccin de cido, (reflujo gastroesofgico), infecciones, intoxicacin alimentaria, ciertos medicamentos (como antinflamatorios no esteroideos),  consumo de alcohol o de tabaco.  Seales del cerebro.Estas seales pueden ser un dolor de cabeza, exposicin al calor, trastornos del odo interno, aumento de la presin en el cerebro por lesiones, infeccin, un tumor o conmocin cerebral, estmulos emocionales o problemas metablicos.  Una obstruccin en el tracto gastrointestinal (obstruccin intestinal).  Ciertas enfermedades como la diabetes, problemas en la vescula biliar, apendicitis, problemas renales, cncer, sepsis, sntomas atpicos de infarto o trastornos alimentarios.  Tratamientos mdicos como la quimioterapia y la radiacin.  Medicamentos que inducen al sueo (anestesia general) durante Clementeen Hoof. DIAGNSTICO  El mdico podr solicitarle algunos anlisis si los problemas no mejoran luego de algunos das. Tambin podrn pedirle anlisis si los sntomas son graves o si el motivo de los vmitos o las nuseas no est claro. Los [employer]  pueden ser:   Anlisis de [place].  Anlisis de [place].  Pruebas de materia fecal.  Cultivos (para buscar evidencias de infeccin).  Radiografas u otros estudios por imgenes. Los [employer] de las pruebas lo ayudarn al mdico a tomar decisiones acerca del mejor curso de tratamiento o la necesidad de [employer].  TRATAMIENTO  Debe estar bien hidratado. Beba con frecuencia pequeas cantidades de lquido.Puede beber agua, bebidas deportivas, caldos claros o comer pequeos trocitos de hielo o gelatina para mantenerse hidratado.Cuando coma, hgalo lentamente para evitar las nuseas.Hay medicamentos para evitar las nuseas que pueden aliviarlo.  INSTRUCCIONES PARA EL CUIDADO DOMICILIARIO  Si su mdico le prescribe medicamentos tmelos como se le haya indicado.  Si no tiene hambre, no se fuerce a comer. Sin embargo, es necesario que tome lquidos.  Si tiene hambre alimntese con una dieta normal, a menos que el mdico le indique otra cosa.  Los mejores alimentos son Ardelia Mems  combinacin de carbohidratos complejos (arroz, trigo, papas, pan), carnes magras, yogur, frutas y Photographer.  Evite los alimentos ricos en grasas porque dificultan la digestin.  Beba gran cantidad de lquido para mantener la orina de tono claro o color amarillo plido.  Si est deshidratado, consulte a su mdico para que le d instrucciones especficas para volver a hidratarlo. Los signos de deshidratacin son:  Doristine Section sed.  Labios y boca secos.  Mareos.  Elmon Else.  Disminucin de la frecuencia y cantidad de la [place].  Confusin.  Tiene el pulso o la respiracin acelerados. SOLICITE ATENCIN MDICA DE INMEDIATO SI:  Vomita sangre o algo similar a la borra del caf.  La materia fecal (heces) es negra o tiene [place].  Sufre una cefalea grave o rigidez en el cuello.  Se siente confundido.  Siente dolor abdominal intenso.  Tiene dolor en el pecho o dificultad para respirar.  No orina por 8 horas.  Tiene la piel fra y pegajosa.  Sigue vomitando durante ms de 24 a 48 horas.  Tiene fiebre. ASEGRESE QUE:   Comprende estas instrucciones.  Controlar su enfermedad.  Solicitar ayuda inmediatamente si no mejora o si empeora.   Esta informacin no tiene Marine scientist el consejo del mdico. Asegrese de hacerle al mdico cualquier pregunta que tenga.   Document Released: 05/06/2007 Document Revised: 07/09/2011 Elsevier Interactive Patient Education 2016 [employer].   Gastroenteritis viral (Viral Gastroenteritis) La gastroenteritis viral tambin es conocida como gripe del [place]. Este trastorno [employer] y el tubo digestivo. Puede causar diarrea y vmitos repentinos. La enfermedad generalmente dura entre 3 y 8 das. La [employer] de las personas desarrolla una respuesta inmunolgica. Con el tiempo, esto elimina el virus. Mientras se desarrolla esta respuesta natural, el virus puede afectar en forma importante su salud.  CAUSAS Muchos virus  diferentes pueden causar gastroenteritis, por ejemplo el rotavirus o el norovirus. Estos virus pueden contagiarse al consumir alimentos o agua contaminados. Tambin puede contagiarse al compartir utensilios u otros artculos personales con una persona infectada o al tocar una superficie contaminada.  SNTOMAS Los sntomas ms comunes son diarrea y vmitos. Estos problemas pueden causar una prdida grave de lquidos corporales(deshidratacin) y un desequilibrio de sales corporales(electrolitos). Otros sntomas pueden ser:   Cristy Hilts.  Dolor de [place].  Fatiga.  Dolor abdominal. DIAGNSTICO  El mdico podr hacer el diagnstico de gastroenteritis viral basndose en los sntomas y el examen fsico Tambin pueden tomarle una muestra de materia fecal para diagnosticar la presencia de virus u otras infecciones.  [place]  sin tratamiento. Los tratamientos estn dirigidos a Neurosurgeon. Los casos ms graves de gastroenteritis viral implican vmitos tan intensos que no es posible retener lquidos. En [employer], los lquidos deben administrarse a travs de una va intravenosa (IV).  INSTRUCCIONES PARA EL CUIDADO DOMICILIARIO  Beba suficientes lquidos para mantener la orina clara o de color amarillo plido. Beba pequeas cantidades de lquido con frecuencia y aumente la cantidad segn la tolerancia.  Pida instrucciones especficas a su mdico con respecto a la rehidratacin.  Evite:  Alimentos que Science writer.  Alcohol.  Gaseosas.  TabacoEden Emms.  Bebidas con cafena.  Lquidos muy calientes o fros.  Alimentos muy grasos.  Comer demasiado a Radiographer, therapeutic.  Productos lcteos hasta 24 a 48 horas despus de que se detenga la diarrea.  Puede consumir probiticos. Los probiticos son cultivos activos de bacterias beneficiosas. Pueden disminuir la cantidad y el nmero de deposiciones diarreicas en el adulto. Se encuentran en los yogures  con cultivos activos y en los suplementos.  Lave bien sus manos para evitar que se disemine el virus.  Slo tome medicamentos de venta libre o recetados para Glass blower/designer, las molestias o bajar la fiebre segn las indicaciones de su mdico. No administre aspirina a los nios. Los medicamentos antidiarreicos no son recomendables.  Consulte a su mdico si puede seguir tomando sus medicamentos recetados o de [employer].  Cumpla con todas las visitas de control, segn le indique su mdico. SOLICITE ATENCIN MDICA DE INMEDIATO SI:  No puede retener lquidos.  No hay emisin de orina durante 6 a 8 horas.  Le falta el aire.  Observa sangre en el vmito (se ve como caf molido) o en la materia fecal.  Siente dolor abdominal que empeora o se concentra en una zona pequea (se localiza).  Tiene nuseas o vmitos persistentes.  Tiene fiebre.  El paciente es un nio menor de 3 meses y [place].  El paciente es un nio mayor de 3 meses, tiene fiebre y sntomas persistentes.  El paciente es un nio mayor de 3 meses y tiene fiebre y sntomas que empeoran repentinamente.  El paciente es un beb y no tiene lgrimas cuando llora. ASEGRESE QUE:   Comprende estas instrucciones.  Controlar su enfermedad.  Solicitar ayuda inmediatamente si no mejora o si empeora.   Esta informacin no tiene Marine scientist el consejo del mdico. Asegrese de hacerle al mdico cualquier pregunta que tenga.   Document Released: 04/16/2005 Document Revised: 07/09/2011 Elsevier Interactive Patient Education [employer].

## 2015-07-22 ENCOUNTER — Ambulatory Visit (INDEPENDENT_AMBULATORY_CARE_PROVIDER_SITE_OTHER): Payer: Worker's Compensation | Admitting: Internal Medicine

## 2015-07-22 VITALS — BP 92/60 | HR 79 | Temp 98.2°F | Resp 16 | Ht 60.0 in | Wt 159.4 lb

## 2015-07-22 DIAGNOSIS — M542 Cervicalgia: Secondary | ICD-10-CM | POA: Diagnosis not present

## 2015-07-22 MED ORDER — MELOXICAM 15 MG PO TABS: 15.0000 mg | ORAL_TABLET | Freq: Every day | ORAL | Status: DC

## 2015-07-22 MED ORDER — CYCLOBENZAPRINE HCL 10 MG PO TABS: ORAL_TABLET | ORAL | Status: DC
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
## 2015-11-22 ENCOUNTER — Other Ambulatory Visit: Payer: Self-pay

## 2015-11-23 ENCOUNTER — Ambulatory Visit (INDEPENDENT_AMBULATORY_CARE_PROVIDER_SITE_OTHER): Payer: Worker's Compensation | Admitting: Family Medicine

## 2015-11-23 DIAGNOSIS — G8929 Other chronic pain: Secondary | ICD-10-CM | POA: Insufficient documentation

## 2015-11-23 DIAGNOSIS — M25512 Pain in left shoulder: Secondary | ICD-10-CM

## 2015-11-23 DIAGNOSIS — M25511 Pain in right shoulder: Secondary | ICD-10-CM

## 2015-11-23 MED ORDER — MELOXICAM 15 MG PO TABS: 15.0000 mg | ORAL_TABLET | Freq: Every day | ORAL | 0 refills | Status: DC
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
## 2015-11-23 NOTE — Patient Instructions (Addendum)
Great to meet you!  I think ou have some injury and involvement of your rotator cuff  Try the pain medications for your shoulder for 1 month, follow up here in 6 weeks.   We could consider a referral and a shoulder injection if you are still having pain at that time.

## 2015-12-24 ENCOUNTER — Ambulatory Visit (INDEPENDENT_AMBULATORY_CARE_PROVIDER_SITE_OTHER): Payer: Commercial Managed Care - HMO | Admitting: Family Medicine

## 2015-12-24 VITALS — BP 90/60 | HR 66 | Temp 98.0°F | Resp 16 | Ht 61.0 in | Wt 159.0 lb

## 2015-12-24 DIAGNOSIS — K219 Gastro-esophageal reflux disease without esophagitis: Secondary | ICD-10-CM

## 2015-12-24 DIAGNOSIS — M10042 Idiopathic gout, left hand: Secondary | ICD-10-CM

## 2015-12-24 DIAGNOSIS — M79642 Pain in left hand: Secondary | ICD-10-CM

## 2015-12-24 DIAGNOSIS — M109 Gout, unspecified: Secondary | ICD-10-CM

## 2015-12-24 DIAGNOSIS — M25542 Pain in joints of left hand: Secondary | ICD-10-CM

## 2015-12-24 LAB — BASIC METABOLIC PANEL WITH GFR
BUN: 21 mg/dL (ref 7–25)
CO2: 25 mmol/L (ref 20–31)
CREATININE: 0.97 mg/dL (ref 0.70–1.33)
Calcium: 9.3 mg/dL (ref 8.6–10.3)
Chloride: 105 mmol/L (ref 98–110)
GFR, Est African American: >89 mL/min (ref >=60)
GFR, Est Non African American: 89 mL/min (ref >=60)
Glucose, Bld: 87 mg/dL (ref 65–99)
Potassium: 3.8 mmol/L (ref 3.5–5.3)
Sodium: 141 mmol/L (ref 135–146)

## 2015-12-24 LAB — SEDIMENTATION RATE: Sed Rate: 1 mm/hr (ref 0–20)

## 2015-12-24 LAB — CBC
HEMATOCRIT: 44.9 % (ref 38.5–50.0)
Hemoglobin: 15.8 g/dL (ref 13.2–17.1)
MCH: 32.4 pg (ref 27.0–33.0)
MCHC: 35.2 g/dL (ref 32.0–36.0)
MCV: 92.2 fL (ref 80.0–100.0)
MPV: 9.6 fL (ref 7.5–12.5)
Platelets: 223 10*3/uL (ref 140–400)
RBC: 4.87 MIL/uL (ref 4.20–5.80)
RDW: 13.2 % (ref 11.0–15.0)
WBC: 5.9 10*3/uL (ref 3.8–10.8)

## 2015-12-24 LAB — URIC ACID: URIC ACID, SERUM: 8.4 mg/dL — AB (ref 4.0–8.0)

## 2015-12-24 MED ORDER — OMEPRAZOLE 20 MG PO CPDR: 20.0000 mg | DELAYED_RELEASE_CAPSULE | Freq: Every day | ORAL | 3 refills | Status: DC

## 2015-12-24 MED ORDER — INDOMETHACIN 50 MG PO CAPS: 50.0000 mg | ORAL_CAPSULE | Freq: Three times a day (TID) | ORAL | 0 refills | Status: DC | PRN
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
## 2015-12-24 NOTE — Patient Instructions (Addendum)
You can reduce your uric acid level by avoiding red meat, saturated fats (dairy fats, butter fats, animal fats), high fructose corn syrup, and beer and drinking more water. There are some easy diet adjustments that you can make to lower your blood uric acid level and thereby hopefully never have to suffer from another gout flair (or at least less frequently).  You should avoid alcohol, drink plenty of water, and try to follow a "low purine" diet as your body produces uric acid when it breaks down prurines-substances that are found naturally in your body, as well as in certain foods such as organ meats, anchioves, herring, asparagus, and mushrooms. Also, increasing your diet in certain foods that may lower uric acid levels is a pretty safe way to decrease your likelihood of gout so consider drinking coffee (regular and/or decaf), eating fruits with Vitamin C in them such as citrus fruits, strawberries, broccoli,  brussel sprouts, papaya, and cantaloupe (though megadoses of vitamin C supplements may do the opposite and increase your body's uric acid levels), and/or eating more cherries and other dark-colored fruits, such as blackberries, blueberries, purple grapes and raspberries. In addition, getting plenty of vitamin A though yellow fruits, or dark green/yellow vegetables at least every other day is good.  Other general diet guidelines for people with gout who need to lower their blood uric acid levels are as follows:  Drink 8 to 16 cups ( about 2 to 4 liters) of fluid each day, with at least half being water. Eat a moderate amount of protein, preferably from healthy sources, such as low-fat or fat-free dairy, tofu, eggs, and nut butters. Limit your daily intake of meat, fish, and poultry to 4 to 6 ounces. Avoid high fat meats and desserts. Decrease your intake of shellfish, beef, lamb, pork, eggs and cheese. Avoid drastic weight reduction or fasting.  If weight loss is desired lose it over a period of  several months.    IF you received an x-ray today, you will receive an invoice from [facility] Rad[facility]. Please contact [facility] Radiology at [phone] with questions or concerns regarding your invoice.   IF you received labwork today, you will receive an invoice from [employer]. Please contact [facility] at [phone] with questions or concerns regarding your invoice.   Our billing staff will not be able to assist you with questions regarding bills from these companies.  You will be contacted with the lab results as soon as they are available. The fastest way to get your results is to activate your My Chart account. Instructions are located on the last page of this paperwork. If you have not heard from [place] regarding the results in 2 weeks, please contact this office.      Gota  (Gout)  La gota es una artritis inflamatoria originada por la acumulacin de cristales de cido rico en las articulaciones. El cido rico es una sustancia qumica que normalmente se encuentra en la [place]. Cuando los niveles de cido rico en la sangre son muy elevados, pueden formarse cristales que se depositan en las articulaciones y los tejidos. Esto causa irritacin, dolor e hinchazn (inflamacin). La repeticin de los ataques es frecuente. Con el tiempo, los cristales de cido rico pueden formar [employer] (tofos) cerca de Insurance claims handler, destruyen el [place] y provocan una deformidad La gota es una enfermedad que puede tratarse y con frecuencia puede prevenirse. CAUSAS  La enfermedad comienza con niveles altos de cido rico en la sangre. El organismo produce cido rico  cuando metaboliza una sustancia que se encuentra en estado natural, denominada purina. Ciertos alimentos, como carnes y pescado, contienen grandes cantidades de purinas. Las causas de cido rico elevado son:   Ser transmitida de padres a hijos (hereditaria).  Enfermedades que causan un aumento de la produccin de  cido rico (como la obesidad, la psoriasis y ciertos tipos de Hotel manager).  Abuso en el consumo de bebidas alcohlicas.  La dieta, especialmente las [employer] se consume [place] carne y frutos de mar.  Ciertos medicamentos, como los que combaten el cncer (quimioterapia), los diurticos y la aspirina.  Enfermedades renales crnicas. Los riones no pueden eliminar bien el cido rico.  Problemas con el metabolismo. Las enfermedades fuertemente asociadas a la gota son:   Arman Bogus.  Hipertensin arterial.  Colesterol elevado.  Diabetes. No todas las personas con niveles elevados de cido rico sufren gota. No se comprende an por qu algunas personas padecen gota y otras no. Las [place], lesiones en una articulacin y el consumo excesivo de ciertos alimentos son algunos de los factores que pueden provocar ataques de [place].  SNTOMAS   Un ataque de gota puede comenzar rpidamente. Causa un dolor intenso, con irritacin, hinchazn y calor en una articulacin.  Puede haber fiebre.  Generalmente slo una articulacin es [place]. Ciertas articulaciones se ven implicadas con ms frecuencia.  La base del dedo gordo del pie.  La rodilla.  El tobillo.  Effie [place].  Un dedo. Sin tratamiento, un ataque por lo general desaparece en unos [place]. Entre un ataque y otro, por lo general no hay sntomas, lo cual es diferente de muchas otras formas de artritis.  DIAGNSTICO  El profesional reunir la informacin basndose en los sntomas y el examen fsico. En algunos casos indicar estudios. Estos estudios pueden ser:   Anlisis de Nectar.  Anlisis de [place].  Radiografas.  Anlisis de los fluidos de Water engineer. En este estudio se necesita una aguja para extraer lquido de la articulacin (artrocentesis). Con el uso del microscopio, se confirma la gota cuando se observan los cristales de cido rico en el lquido de Water engineer. TRATAMIENTO  [employer] fases en  el tratamiento de la gota: el tratamiento del ataque de aparicin repentina (agudo) y la prevencin de los ataques (profilaxis).   Tratamiento de un ataque agudo.  Se utilizan medicamentos. Se indican antiinflamatorios o corticoides.  En algunos casos es necesario inyectar un corticoide en la articulacin afectada.  La articulacin dolorosa se pone en reposo. El movimiento puede empeorar la artritis.  Puede utilizar tanto tratamientos con calor o con fro para Teacher, adult education, segn lo que le haga mejor.  Tratamiento para prevenir ataques.  Si sufre de ataques de gota frecuentes, el mdico puede recomendarle medicamentos preventivos. Estos medicamentos se administran despus de que el ataque agudo [place]. Estos medicamentos ayudan a los riones a Tour manager cido rico del organismo o a Armed forces technical officer produccin de cido rico. Es posible que deba Risk manager estos medicamentos durante un largo [place].  La primera fase del tratamiento preventiva puede asociarse con un aumento de los ataques agudos de [place]. Por esta razn, durante los primeros meses de [place], [place] mdico puede tambin aconsejarle que tome medicamentos que habitualmente se [place] para el tratamiento de la gota aguda. Asegrese de comprender todas las indicaciones del mdico. El mdico podr hacer varios ajustes a la dosis del medicamento antes de que comiencen a ser Hotel manager.  Comente el tratamiento diettico con su  mdico o nutricionista. El alcohol y las bebidas que contienen gran cantidad de Location manager y fructosa y los alimentos como la carne, el pollo y los frutos de mar pueden aumentar los niveles de cido [place] o el nutricionista podr [employer] las bebidas y los alimentos que debe limitar. INSTRUCCIONES PARA EL CUIDADO EN EL HOGAR   No tome aspirina para el dolor. Esto eleva los niveles de cido rico.  Slo tome medicamentos de venta libre o prescriptos para Glass blower/designer, las  molestias o Sports coach la fiebre segn las indicaciones de su mdico.  Secondary school teacher reposo todo el tiempo que pueda. Cuando se encuentre en la cama, mantenga las sbanas y mantas alejadas de las articulaciones doloridas.  Mantenga la articulacin afectada levantada (elevada).  Aplique compresas fras o calientes sobre las articulaciones doloridas. el uso de compresas calientes o fras depende de lo que [employer] resulte a usted.  Utilice muletas si la articulacin que le duele es de la pierna.  Debe ingerir gran cantidad de lquido para mantener la orina de tono claro o color amarillo plido. Esto ayudar a que el organismo elimine el cido rico. Limite el consumo de alcohol, bebidas azucaradas y que contengan fructosa.  Siga las indicaciones con respecto a la dieta. Preste especial atencin a la cantidad de protenas que consume. En la dieta diaria debe enfatizar el consumo de frutas, vegetales, granos enteros y productos lcteos descremados. Comente con su mdico o nutricionista acerca del consumo de caf, vitamina C o cerezas. Estos pueden ayudar a [employer] de cido rico.  Mantenga un peso corporal adecuado. SOLICITE ATENCIN MDICA SI:   Tiene diarrea, vmitos o algn efecto secundario provocado por los medicamentos.  No se siente mejor en 24 horas, o empeora. SOLICITE ATENCIN MDICA DE INMEDIATO SI:   Las articulaciones le duelen ms de manera repentina, tiene escalofros o fiebre. ASEGRESE DE QUE:   Comprende estas instrucciones.  Controlar su enfermedad.  Solicitar ayuda de inmediato si no mejora o si empeora.   Esta informacin no tiene Marine scientist el consejo del mdico. Asegrese de hacerle al mdico cualquier pregunta que tenga.   Document Released: 01/24/2005 Document Revised: 08/11/2012 Elsevier Interactive Patient Education [employer].

## 2015-12-25 ENCOUNTER — Encounter: Payer: Self-pay | Admitting: Family Medicine

## 2016-01-05 ENCOUNTER — Other Ambulatory Visit: Payer: Self-pay | Admitting: Family Medicine

## 2016-01-06 ENCOUNTER — Ambulatory Visit (INDEPENDENT_AMBULATORY_CARE_PROVIDER_SITE_OTHER): Payer: Worker's Compensation | Admitting: Family Medicine

## 2016-01-06 DIAGNOSIS — M545 Low back pain: Secondary | ICD-10-CM

## 2016-01-06 DIAGNOSIS — M79602 Pain in left arm: Secondary | ICD-10-CM

## 2016-01-06 DIAGNOSIS — R0789 Other chest pain: Secondary | ICD-10-CM | POA: Diagnosis not present

## 2016-01-06 DIAGNOSIS — M25512 Pain in left shoulder: Secondary | ICD-10-CM
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
## 2016-01-19 ENCOUNTER — Other Ambulatory Visit: Payer: Worker's Compensation | Admitting: Family Medicine

## 2016-01-26 ENCOUNTER — Ambulatory Visit (INDEPENDENT_AMBULATORY_CARE_PROVIDER_SITE_OTHER): Payer: Worker's Compensation | Admitting: Family Medicine

## 2016-01-26 ENCOUNTER — Encounter: Payer: Self-pay | Admitting: Family Medicine

## 2016-01-26 DIAGNOSIS — M79602 Pain in left arm: Secondary | ICD-10-CM | POA: Diagnosis not present

## 2016-01-26 DIAGNOSIS — R0789 Other chest pain: Secondary | ICD-10-CM

## 2016-01-26 DIAGNOSIS — M545 Low back pain: Secondary | ICD-10-CM | POA: Diagnosis not present

## 2016-01-26 DIAGNOSIS — M25512 Pain in left shoulder: Secondary | ICD-10-CM

## 2016-01-26 NOTE — Patient Instructions (Addendum)
  As you're symptoms have resolved, and continue to be pain-free (symptom-free) off of medications, I did not determine any persistent deficits or reasons for restrictions. If any future pain or issues, be seen by primary care provider or advise your employer.  Let me know if there are any questions in the meantime.  No restrictions or medications needed at this time.  IF you received an x-ray today, you will receive an invoice from [facility]. Please contact [facility] at [phone] with questions or concerns regarding your invoice.   IF you received labwork today, you will receive an invoice from [employer]. Please contact [facility] at [phone] with questions or concerns regarding your invoice.   Our billing staff will not be able to assist you with questions regarding bills from these companies.  You will be contacted with the lab results as soon as they are available. The fastest way to get your results is to activate your My Chart account. Instructions are located on the last page of this paperwork. If you have not heard from [place] regarding the results in 2 weeks, please contact this office.
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
## 2017-03-01 ENCOUNTER — Encounter: Payer: Self-pay | Admitting: Gastroenterology

## 2017-05-10 ENCOUNTER — Encounter: Payer: Self-pay | Admitting: Gastroenterology

## 2017-06-04 ENCOUNTER — Encounter: Payer: Self-pay | Admitting: Medical

## 2017-09-25 IMAGING — CR DG RIBS W/ CHEST 3+V*L*
3 series · 3 of 3 positions shown · non-contrast
Comparison: 05/04/2013.

CLINICAL DATA: Fall.  Pain.

EXAM:
LEFT RIBS AND CHEST - 3+ VIEW

[PA]
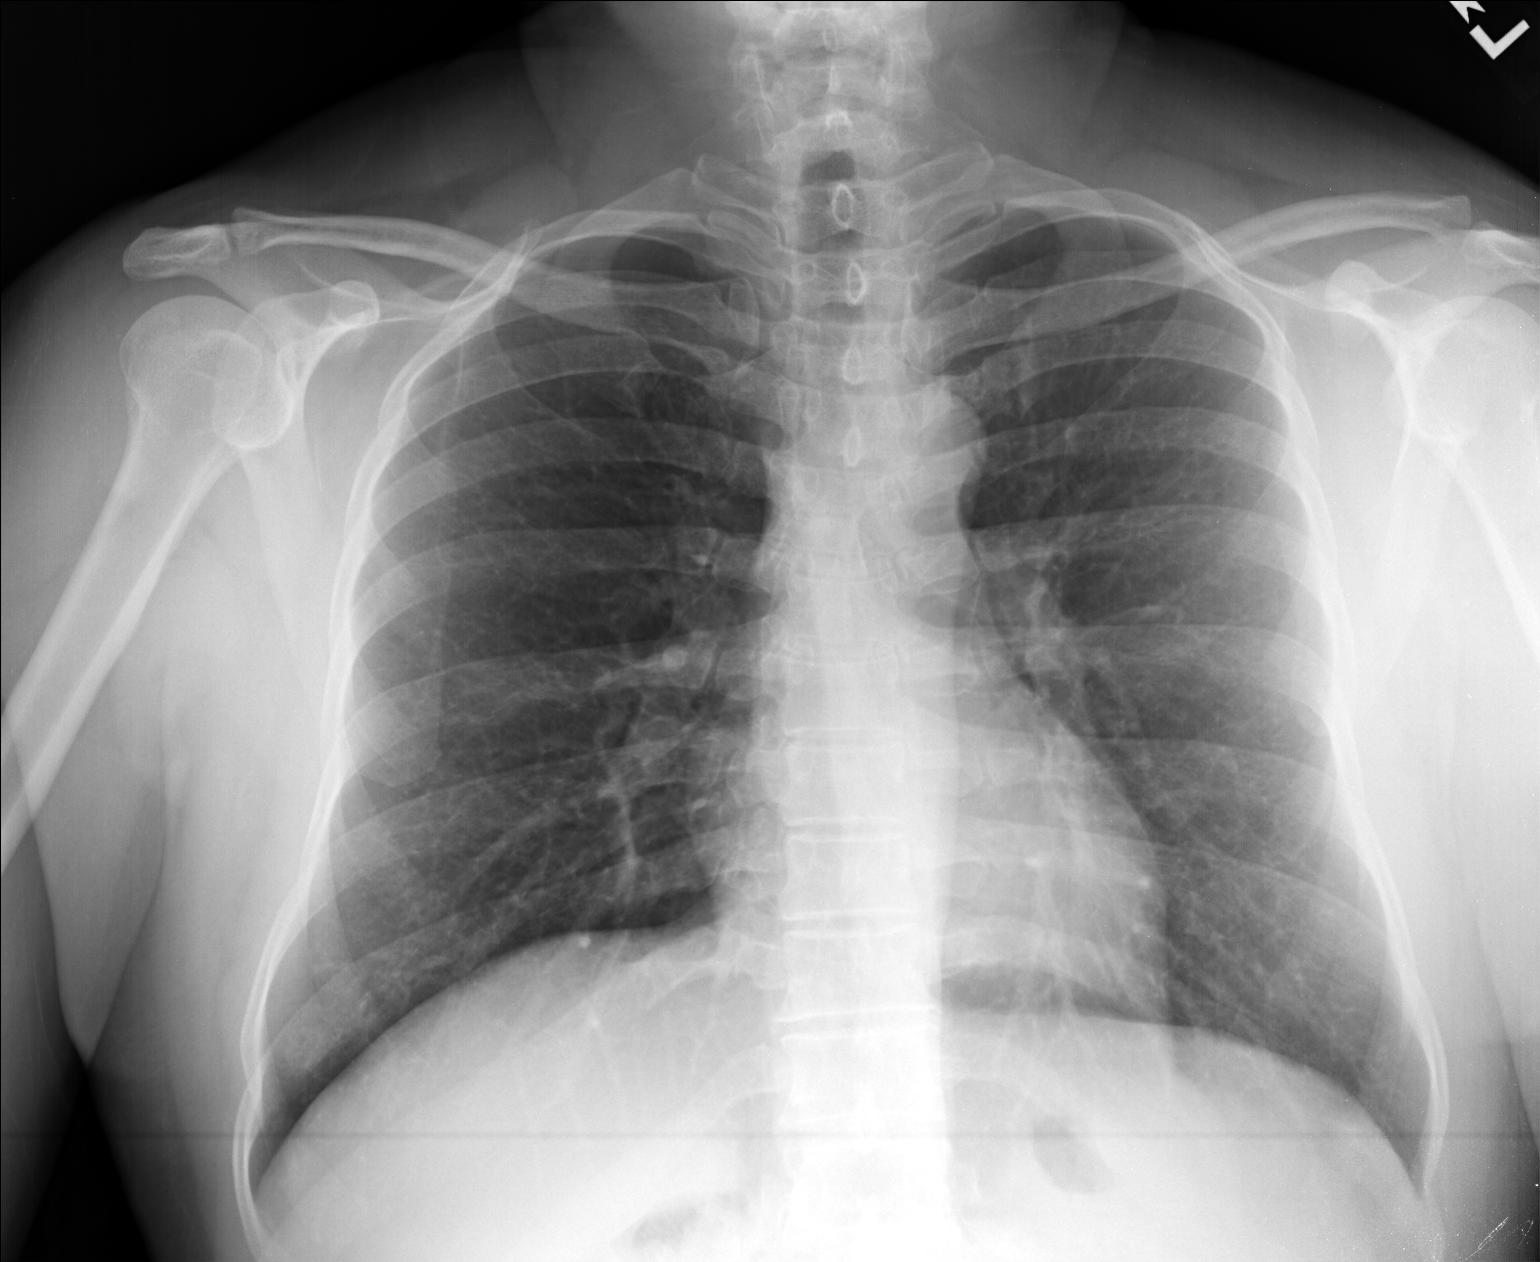

[rao]
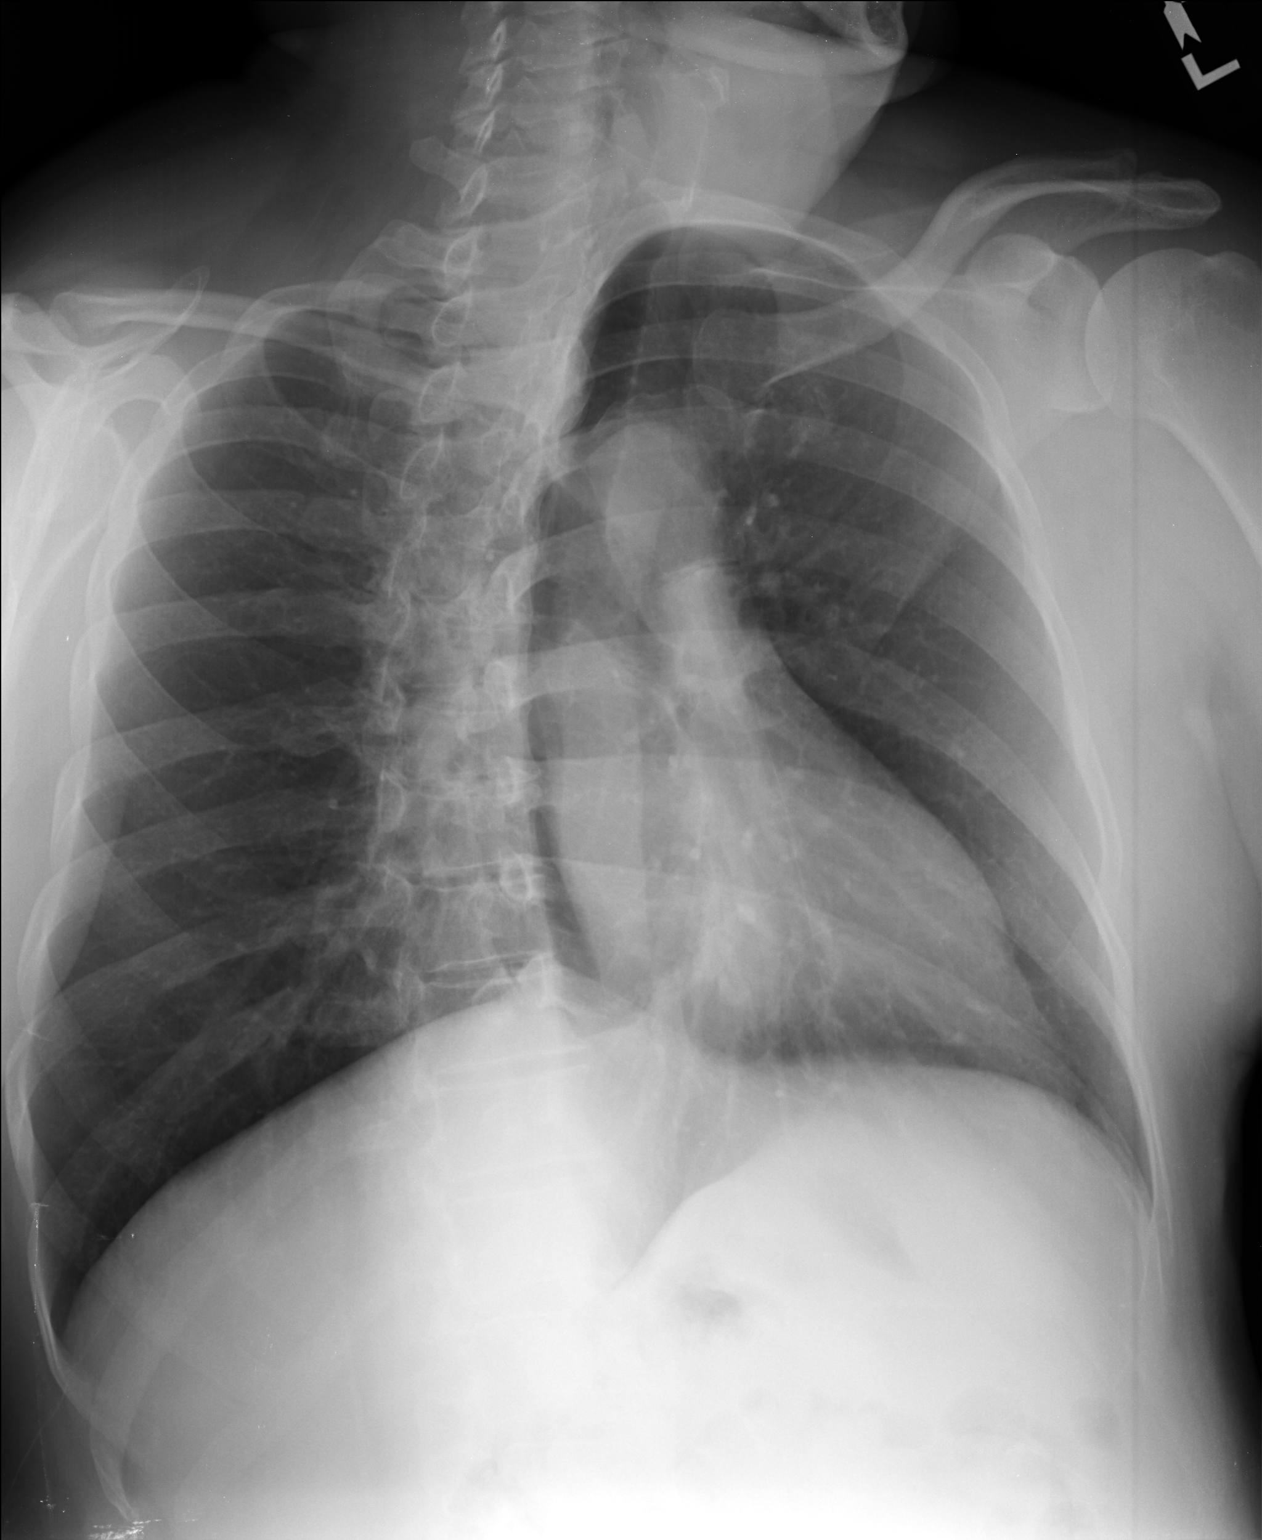

[lao]
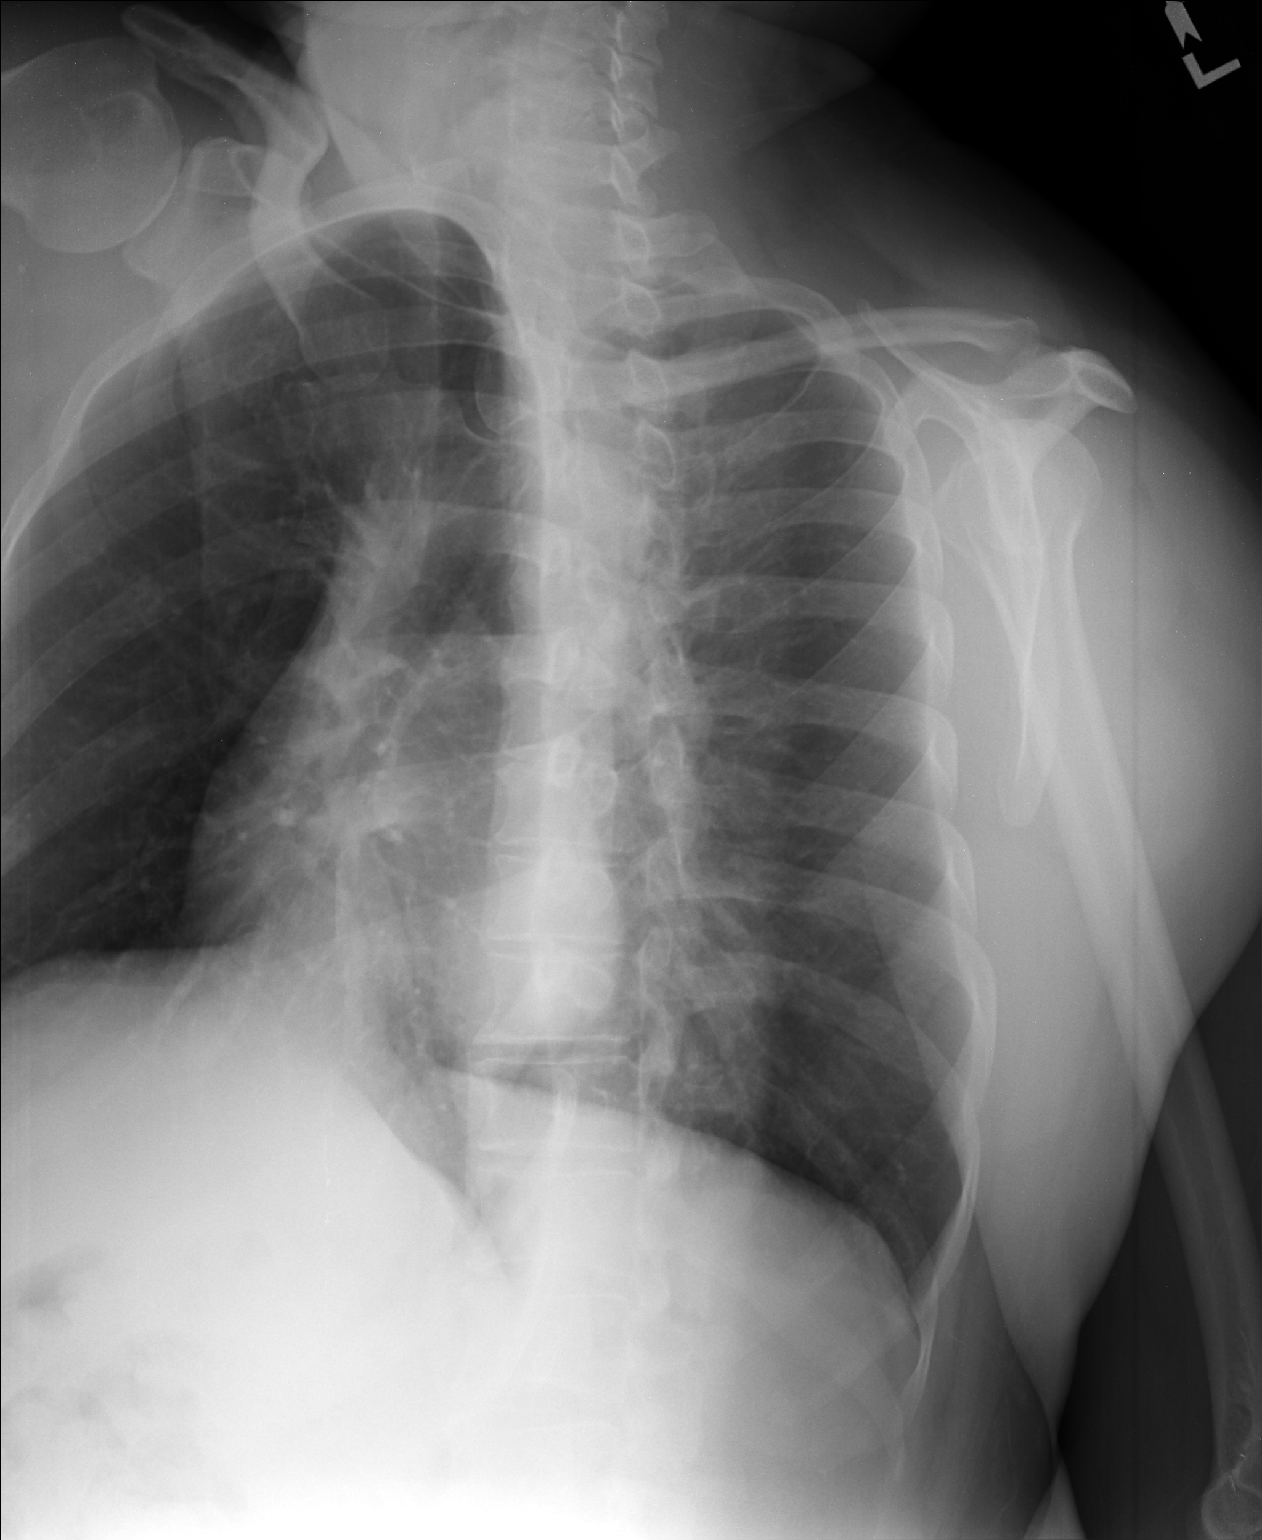

[3 of 3 positions shown; findings below may reference images not displayed]

FINDINGS: No fracture or other bone lesions are seen involving the ribs. There
is no evidence of pneumothorax or pleural effusion. Both lungs are
clear. Heart size and mediastinal contours are within normal limits.
IMPRESSION: No acute abnormality identified. No evidence fracture. No evidence
of pneumothorax
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
## 2017-10-04 ENCOUNTER — Encounter: Payer: Self-pay | Admitting: Emergency Medicine

## 2017-10-04 ENCOUNTER — Other Ambulatory Visit: Payer: Self-pay

## 2017-10-04 ENCOUNTER — Ambulatory Visit: Payer: 59 | Admitting: Emergency Medicine

## 2017-10-04 VITALS — BP 126/66 | HR 90 | Temp 98.2°F | Resp 16 | Ht 60.25 in | Wt 155.0 lb

## 2017-10-04 DIAGNOSIS — R1030 Lower abdominal pain, unspecified: Secondary | ICD-10-CM | POA: Diagnosis not present

## 2017-10-04 DIAGNOSIS — K6289 Other specified diseases of anus and rectum: Secondary | ICD-10-CM | POA: Diagnosis not present

## 2017-10-04 DIAGNOSIS — Z1211 Encounter for screening for malignant neoplasm of colon: Secondary | ICD-10-CM | POA: Diagnosis not present

## 2017-10-04 DIAGNOSIS — R300 Dysuria: Secondary | ICD-10-CM

## 2017-10-04 LAB — POCT URINALYSIS DIP (MANUAL ENTRY)
Bilirubin, UA: NEGATIVE
Glucose, UA: NEGATIVE mg/dL
Ketones, POC UA: NEGATIVE mg/dL
LEUKOCYTES UA: NEGATIVE
Nitrite, UA: NEGATIVE
Protein Ur, POC: NEGATIVE mg/dL
Spec Grav, UA: 1.01 (ref 1.010–1.025)
Urobilinogen, UA: 0.2 E.U./dL
pH, UA: 7 (ref 5.0–8.0)

## 2017-10-04 LAB — POCT CBC
GRANULOCYTE PERCENT: 63.7 % (ref 37–80)
HCT, POC: 48 % (ref 43.5–53.7)
Hemoglobin: 15.5 g/dL (ref 14.1–18.1)
Lymph, poc: 1.6 (ref 0.6–3.4)
MCH: 30.4 pg (ref 27–31.2)
MCHC: 32.2 g/dL (ref 31.8–35.4)
MCV: 94.3 fL (ref 80–97)
MID (cbc): 0.5 (ref 0–0.9)
MPV: 6.3 fL (ref 0–99.8)
PLATELET COUNT, POC: 264 10*3/uL (ref 142–424)
POC Granulocyte: 3.7 (ref 2–6.9)
POC LYMPH %: 27.6 % (ref 10–50)
POC MID %: 8.7 %M (ref 0–12)
RBC: 5.09 M/uL (ref 4.69–6.13)
RDW, POC: 12.6 %
WBC: 5.8 10*3/uL (ref 4.6–10.2)

## 2017-10-04 MED ORDER — CIPROFLOXACIN HCL 500 MG PO TABS: 500.0000 mg | ORAL_TABLET | Freq: Two times a day (BID) | ORAL | 0 refills | Status: DC

## 2017-10-04 MED ORDER — HYDROCORTISONE 2.5 % RE CREA: 1.0000 "application " | TOPICAL_CREAM | Freq: Two times a day (BID) | RECTAL | 0 refills | Status: DC

## 2017-10-04 NOTE — Patient Instructions (Addendum)
     IF you received an x-ray today, you will receive an invoice from [facility]. Please contact [facility] at [phone] with questions or concerns regarding your invoice.   IF you received labwork today, you will receive an invoice from [place]. Please contact [facility] at [phone] with questions or concerns regarding your invoice.   Our billing staff will not be able to assist you with questions regarding bills from these companies.  You will be contacted with the lab results as soon as they are available. The fastest way to get your results is to activate your My Chart account. Instructions are located on the last page of this paperwork. If you have not heard from [place] regarding the results in 2 weeks, please contact this office.     Dolor abdominal en los adultos Abdominal Pain, Adult El dolor de [place] (abdominal) puede tener muchas causas. [place] veces, el dolor de [place] no es peligroso. Muchos de [employer] de dolor de estmago pueden controlarse y tratarse en casa. Sin embargo, a Clinical cytogeneticist, Conservation officer, historic buildings de [employer] puede ser grave. El mdico intentar descubrir la causa del dolor de [place]. Siga estas indicaciones en su casa:  Tome los medicamentos de venta libre y los recetados solamente como se lo haya indicado el mdico. No tome medicamentos que lo ayuden a Landscape architect (laxantes), salvo que el mdico se lo indique.  Beba suficiente lquido para mantener el pis (orina) claro o de color amarillo plido.  Est atento al dolor de estmago para Actuary cambio.  Concurra a todas las visitas de control como se lo haya indicado el mdico. Esto es importante. Comunquese con un mdico si:  El dolor de estmago cambia o [place].  No tiene apetito o baja de peso sin proponrselo.  Tiene dificultades para defecar (est estreido) o heces lquidas (diarrea) durante ms de 2 o 3das.  Siente dolor al orinar o defecar.  El dolor de estmago  lo despierta de noche.  El dolor empeora con las comidas, despus de comer o con determinados alimentos.  Vomita y no puede retener nada de lo que ingiere.  Tiene fiebre. Solicite ayuda de inmediato si:  El dolor no desaparece en el tiempo indicado por el mdico.  No puede detener los vmitos.  Siente dolor solamente en zonas especficas del abdomen, como el lado derecho o la parte inferior izquierda.  Tiene heces con sangre, de color negro o con aspecto alquitranado.  Tiene dolor muy intenso en el vientre, clicos o meteorismo.  Presenta signos de no tener suficientes lquidos o agua en el cuerpo (deshidratacin), por ejemplo: ? [place] es [place], es muy escasa o no orina. ? Labios agrietados. ? [employer]. ? Ojos hundidos. ? Somnolencia. ? Debilidad. Esta informacin no tiene Marine scientist el consejo del mdico. Asegrese de hacerle al mdico cualquier pregunta que tenga. Document Released: 07/13/2008 Document Revised: 07/11/2016 Document Reviewed: 09/28/2015 Elsevier Interactive Patient Education  [employer].

## 2017-10-04 NOTE — Progress Notes (Signed)
Jacob Dixon 57 y.o.   Chief Complaint  Patient presents with  . Abdominal Pain    x 2 weeks and per patient hurt when pee pee  . Back Pain    lower and upper chest to upper back    HISTORY OF PRESENT ILLNESS: This is a 57 y.o. male complaining of lower abdominal pain with some radiation into the flank areas and some burning on urination.  It all started about 3 weeks ago with flulike symptoms.  Those got better.  He gets occasional palpitations.  He started with colicky lower abdominal pain 1 to 2 weeks ago.  Pain is sharp and radiates to the flanks.  Has some rectal pain and some burning with urination.  Wife also has a UTI.  Last sexual intercourse about 1 month ago with wife.  Eating and drinking well.  Denies nausea or vomiting.  Denies fever or chills.  Denies rectal bleeding or blood in the urine.  HPI   Prior to Admission medications   Medication Sig Start Date End Date Taking? Authorizing Provider  indomethacin (INDOCIN) 50 MG capsule Take 1 capsule (50 mg total) by mouth 3 (three) times daily as needed (for gout flair). Patient not taking: Reported on 01/26/2016 12/24/15   Shawnee Knapp, MD  meloxicam (MOBIC) 15 MG tablet Take 15 mg by mouth daily.    [provider]  omeprazole (PRILOSEC) 20 MG capsule Take 1 capsule (20 mg total) by mouth daily. Patient not taking: Reported on 01/26/2016 12/24/15   Shawnee Knapp, MD    No Known Allergies  Patient Active Problem List   Diagnosis Date Noted  . Left shoulder pain 11/23/2015    Past Medical History:  Diagnosis Date  . Allergy   . Anemia   . Arthritis   . Neuromuscular disorder ([place])     No past surgical history on file.  Social History   Socioeconomic History  . Marital status: Married    Spouse name: Not on file  . Number of children: Not on file  . Years of education: Not on file  . Highest education level: Not on file  Occupational History  . Not on file  Social Needs  . Financial resource  strain: Not on file  . Food insecurity:    Worry: Not on file    Inability: Not on file  . Transportation needs:    Medical: Not on file    Non-medical: Not on file  Tobacco Use  . Smoking status: Never Smoker  . Smokeless tobacco: Never Used  Substance and Sexual Activity  . Alcohol use: No  . Drug use: No  . Sexual activity: Not on file  Lifestyle  . Physical activity:    Days per week: Not on file    Minutes per session: Not on file  . Stress: Not on file  Relationships  . Social connections:    Talks on phone: Not on file    Gets together: Not on file    Attends religious service: Not on file    Active member of club or organization: Not on file    Attends meetings of clubs or organizations: Not on file    Relationship status: Not on file  . Intimate partner violence:    Fear of current or ex partner: Not on file    Emotionally abused: Not on file    Physically abused: Not on file    Forced sexual activity: Not on file  Other Topics  Concern  . Not on file  Social History Narrative  . Not on file    Family History  Problem Relation Age of Onset  . Cancer Mother      Review of Systems  Constitutional: Negative.  Negative for fever.  HENT: Negative.  Negative for sore throat.   Eyes: Negative.   Respiratory: Negative.  Negative for cough and shortness of breath.   Cardiovascular: Negative.  Negative for chest pain and palpitations.  Gastrointestinal: Positive for abdominal pain. Negative for blood in stool, diarrhea, melena, nausea and vomiting.  Genitourinary: Positive for dysuria, flank pain and frequency.  Musculoskeletal: Negative for back pain, myalgias and neck pain.  Skin: Negative.  Negative for rash.  Neurological: Negative.  Negative for dizziness and headaches.  Endo/Heme/Allergies: Negative.   All other systems reviewed and are negative.   Vitals:   10/04/17 1653  BP: 126/66  Pulse: 90  Resp: 16  Temp: 98.2 F (36.8 C)  SpO2: 99%     Physical Exam  Constitutional: He is oriented to person, place, and time. He appears well-developed and well-nourished.  HENT:  Head: Normocephalic and atraumatic.  Eyes: Pupils are equal, round, and reactive to light. Conjunctivae and EOM are normal.  Neck: Normal range of motion. Neck supple.  Cardiovascular: Normal rate, regular rhythm and normal heart sounds.  Pulmonary/Chest: Effort normal and breath sounds normal.  Abdominal: Soft. Normal appearance and bowel sounds are normal. He exhibits no distension, no ascites, no pulsatile midline mass and no mass. There is no hepatosplenomegaly. There is tenderness in the suprapubic area. There is no rigidity, no rebound, no guarding, no CVA tenderness, no tenderness at McBurney's point and negative Murphy's sign. No hernia.  Genitourinary: Rectum normal. Rectal exam shows no external hemorrhoid, no fissure, no mass and no tenderness.  Musculoskeletal: Normal range of motion. He exhibits no edema.  Neurological: He is alert and oriented to person, place, and time. No sensory deficit. He exhibits normal muscle tone.  Skin: Skin is warm and dry. Capillary refill takes less than 2 seconds. No rash noted.  Psychiatric: He has a normal mood and affect. His behavior is normal.  Vitals reviewed.  Results for orders placed or performed in visit on 10/04/17 (from the past 24 hour(s))  POCT CBC     Status: None   Collection Time: 10/04/17  5:39 PM  Result Value Ref Range   WBC 5.8 4.6 - 10.2 K/uL   Lymph, poc 1.6 0.6 - 3.4   POC LYMPH PERCENT 27.6 10 - 50 %L   MID (cbc) 0.5 0 - 0.9   POC MID % 8.7 0 - 12 %M   POC Granulocyte 3.7 2 - 6.9   Granulocyte percent 63.7 37 - 80 %G   RBC 5.09 4.69 - 6.13 M/uL   Hemoglobin 15.5 14.1 - 18.1 g/dL   HCT, POC 48.0 43.5 - 53.7 %   MCV 94.3 80 - 97 fL   MCH, POC 30.4 27 - 31.2 pg   MCHC 32.2 31.8 - 35.4 g/dL   RDW, POC 12.6 %   Platelet Count, POC 264 142 - 424 K/uL   MPV 6.3 0 - 99.8 fL  POCT  urinalysis dipstick     Status: Abnormal   Collection Time: 10/04/17  5:41 PM  Result Value Ref Range   Color, UA yellow yellow   Clarity, UA clear clear   Glucose, UA negative negative mg/dL   Bilirubin, UA negative negative   Ketones, POC UA  negative negative mg/dL   Spec Grav, UA 1.010 1.010 - 1.025   Blood, UA trace-intact (A) negative   pH, UA 7.0 5.0 - 8.0   Protein Ur, POC negative negative mg/dL   Urobilinogen, UA 0.2 0.2 or 1.0 E.U./dL   Nitrite, UA Negative Negative   Leukocytes, UA Negative Negative     ASSESSMENT & PLAN: Dicky was seen today for abdominal pain and back pain.  Diagnoses and all orders for this visit:  Dysuria -     POCT urinalysis dipstick -     POCT CBC -     Urine Culture -     ciprofloxacin (CIPRO) 500 MG tablet; Take 1 tablet (500 mg total) by mouth 2 (two) times daily for 7 days.  Colon cancer screening -     Ambulatory referral to Gastroenterology  Lower abdominal pain  Rectal pain -     hydrocortisone (ANUSOL-HC) 2.5 % rectal cream; Place 1 application rectally 2 (two) times daily.    Patient Instructions       IF you received an x-ray today, you will receive an invoice from [facility]. Please contact [facility] at [phone] with questions or concerns regarding your invoice.   IF you received labwork today, you will receive an invoice from [facility]. Please contact [facility] at [phone] with questions or concerns regarding your invoice.   Our billing staff will not be able to assist you with questions regarding bills from these companies.  You will be contacted with the lab results as soon as they are available. The fastest way to get your results is to activate your My Chart account. Instructions are located on the last page of this paperwork. If you have not heard from [place] regarding the results in 2 weeks, please contact this office.     Dolor abdominal en los adultos Abdominal Pain, Adult El  dolor de [place] (abdominal) puede tener muchas causas. [place] veces, el dolor de [place] no es peligroso. Muchos de [employer] de dolor de estmago pueden controlarse y tratarse en casa. Sin embargo, a Clinical cytogeneticist, Conservation officer, historic buildings de [employer] puede ser grave. El mdico intentar descubrir la causa del dolor de [place]. Siga estas indicaciones en su casa:  Tome los medicamentos de venta libre y los recetados solamente como se lo haya indicado el mdico. No tome medicamentos que lo ayuden a Landscape architect (laxantes), salvo que el mdico se lo indique.  Beba suficiente lquido para mantener el pis (orina) claro o de color amarillo plido.  Est atento al dolor de estmago para Actuary cambio.  Concurra a todas las visitas de control como se lo haya indicado el mdico. Esto es importante. Comunquese con un mdico si:  El dolor de estmago cambia o [place].  No tiene apetito o baja de peso sin proponrselo.  Tiene dificultades para defecar (est estreido) o heces lquidas (diarrea) durante ms de 2 o 3das.  Siente dolor al orinar o defecar.  El dolor de estmago lo despierta de noche.  El dolor empeora con las comidas, despus de comer o con determinados alimentos.  Vomita y no puede retener nada de lo que ingiere.  Tiene fiebre. Solicite ayuda de inmediato si:  El dolor no desaparece en el tiempo indicado por el mdico.  No puede detener los vmitos.  Siente dolor solamente en zonas especficas del abdomen, como el lado derecho o la parte inferior izquierda.  Tiene heces con sangre, de color negro o con aspecto alquitranado.  Tiene  dolor muy intenso en el vientre, clicos o meteorismo.  Presenta signos de no tener suficientes lquidos o agua en el cuerpo (deshidratacin), por ejemplo: ? [place] es [place], es muy escasa o no orina. ? Labios agrietados. ? [employer]. ? Ojos hundidos. ? Somnolencia. ? Debilidad. Esta informacin no tiene Marine scientist el  consejo del mdico. Asegrese de hacerle al mdico cualquier pregunta que tenga. Document Released: 07/13/2008 Document Revised: 07/11/2016 Document Reviewed: 09/28/2015 Elsevier Interactive Patient Education  2018 Elsevier Inc.      Agustina Caroli, MD [facility]

## 2017-10-08 ENCOUNTER — Telehealth: Payer: Self-pay | Admitting: *Deleted

## 2017-10-08 ENCOUNTER — Ambulatory Visit: Payer: 59 | Admitting: Emergency Medicine

## 2017-10-08 ENCOUNTER — Other Ambulatory Visit: Payer: Self-pay

## 2017-10-08 ENCOUNTER — Encounter: Payer: Self-pay | Admitting: Emergency Medicine

## 2017-10-08 VITALS — BP 106/64 | HR 92 | Temp 98.4°F | Resp 16 | Ht 60.25 in | Wt 158.0 lb

## 2017-10-08 DIAGNOSIS — K6289 Other specified diseases of anus and rectum: Secondary | ICD-10-CM

## 2017-10-08 DIAGNOSIS — R1030 Lower abdominal pain, unspecified: Secondary | ICD-10-CM | POA: Diagnosis not present

## 2017-10-08 DIAGNOSIS — R300 Dysuria: Secondary | ICD-10-CM | POA: Diagnosis not present

## 2017-10-08 LAB — POCT CBC
Granulocyte percent: 69.1 %G (ref 37–80)
HCT, POC: 48.3 % (ref 43.5–53.7)
HEMOGLOBIN: 16.1 g/dL (ref 14.1–18.1)
Lymph, poc: 1.9 (ref 0.6–3.4)
MCH, POC: 31.3 pg — AB (ref 27–31.2)
MCHC: 33.3 g/dL (ref 31.8–35.4)
MCV: 93.9 fL (ref 80–97)
MID (cbc): 0.5 (ref 0–0.9)
MPV: 6.3 fL (ref 0–99.8)
POC Granulocyte: 5.5 (ref 2–6.9)
POC LYMPH PERCENT: 24.2 %L (ref 10–50)
POC MID %: 6.7 % (ref 0–12)
Platelet Count, POC: 267 10*3/uL (ref 142–424)
RBC: 5.14 M/uL (ref 4.69–6.13)
RDW, POC: 12 %
WBC: 7.9 10*3/uL (ref 4.6–10.2)

## 2017-10-08 LAB — POCT URINALYSIS DIP (MANUAL ENTRY)
BILIRUBIN UA: NEGATIVE
Blood, UA: NEGATIVE
Glucose, UA: NEGATIVE mg/dL
Ketones, POC UA: NEGATIVE mg/dL
Leukocytes, UA: NEGATIVE
NITRITE UA: NEGATIVE
Protein Ur, POC: NEGATIVE mg/dL
Spec Grav, UA: 1.01 (ref 1.010–1.025)
Urobilinogen, UA: 0.2 E.U./dL
pH, UA: 7 (ref 5.0–8.0)

## 2017-10-08 LAB — POC MICROSCOPIC URINALYSIS (UMFC): Mucus: ABSENT

## 2017-10-08 MED ORDER — HYDROCORTISONE 2.5 % RE CREA: 1.0000 "application " | TOPICAL_CREAM | Freq: Two times a day (BID) | RECTAL | 0 refills | Status: DC

## 2017-10-08 MED ORDER — PHENAZOPYRIDINE HCL 200 MG PO TABS: 200.0000 mg | ORAL_TABLET | Freq: Three times a day (TID) | ORAL | 0 refills | Status: DC | PRN

## 2017-10-08 MED ORDER — CEFADROXIL 500 MG PO CAPS
500.0000 mg | ORAL_CAPSULE | Freq: Two times a day (BID) | ORAL | 0 refills | Status: DC
Start: 2017-10-08 — End: 2017-10-12

## 2017-10-08 MED ORDER — CEFTRIAXONE SODIUM 1 G IJ SOLR
1.0000 g | Freq: Once | INTRAMUSCULAR | Status: AC
  Administered 2017-10-08: 1 g via INTRAMUSCULAR

## 2017-10-08 NOTE — Patient Instructions (Addendum)
   IF you received an x-ray today, you will receive an invoice from [facility]. Please contact [facility] at [phone] with questions or concerns regarding your invoice.   IF you received labwork today, you will receive an invoice from [facility]. Please contact [facility] at [phone] with questions or concerns regarding your invoice.   Our billing staff will not be able to assist you with questions regarding bills from these companies.  You will be contacted with the lab results as soon as they are available. The fastest way to get your results is to activate your My Chart account. Instructions are located on the last page of this paperwork. If you have not heard from us regarding the results in 2 weeks, please contact this office.    Disuria (Dysuria) La disuria es dolor o molestia al orinar. El dolor o la molestia se pueden sentir en el conducto que transporta la orina fuera de la vejiga (uretra) o en el tejido que rodea los genitales. El dolor tambin se puede sentir en la zona de la ingle y en la parte inferior del abdomen y de la espalda. Quizs tenga que orinar con frecuencia o la sensacin repentina de tener que orinar (tenesmo vesical). La disuria puede afectar tanto a hombres como a mujeres, pero es ms comn en las mujeres. La causa puede deberse a muchos problemas diferentes:  Infeccin en las vas urinarias en mujeres.  Infeccin en los riones o la vejiga.  Clculos en los riones o la vejiga.  Ciertas enfermedades de transmisin sexual (ETS), como la clamidia.  Deshidratacin.  Inflamacin de la vagina.  Uso de ciertos medicamentos.  Uso de ciertos jabones o productos perfumados que provocan irritacin. INSTRUCCIONES PARA EL CUIDADO EN EL HOGAR Controle su disuria para ver si hay cambios. Las siguientes indicaciones pueden ayudar a aliviar cualquier molestia que pueda sentir:  Beba suficiente lquido para mantener la orina clara o de color  amarillo plido.  Vace la vejiga con frecuencia. Evite retener la orina durante largos perodos.  Despus de defecar, las mujeres deben limpiarse desde adelante hacia atrs, usando el papel higinico solo una vez.  Vace la vejiga despus de tener relaciones sexuales.  Tome los medicamentos solamente como se lo haya indicado el mdico.  Si le recetaron antibiticos, asegrese de terminarlos, incluso si comienza a sentirse mejor.  Evite la cafena, el t y el alcohol. Estos productos pueden irritar la vejiga y empeorar la disuria. En los hombres, el alcohol puede irritar la prstata.  Concurra a todas las visitas de control como se lo haya indicado el mdico. Esto es importante.  Si le realizaron pruebas para detectar la causa de la disuria, es su responsabilidad retirar los resultados. Consulte en el laboratorio o en el departamento en el que fue realizado el estudio cundo y cmo podr obtener los resultados. Hable con el mdico si tiene alguna pregunta sobre los resultados. SOLICITE ATENCIN MDICA SI:  Siente dolor en la espalda o a los costados del cuerpo.  Tiene fiebre.  Tiene nuseas o vmitos.  Observa sangre en la orina.  Est orinando con ms frecuencia que lo habitual. SOLICITE ATENCIN MDICA DE INMEDIATO SI:  El dolor es intenso y no se alivia con los medicamentos.  No puede retener lquido.  Usted u otra persona advierten algn cambio en su funcin mental.  Tiene una frecuencia cardaca acelerada en reposo.  Tiene temblores o escalofros.  Se siente muy dbil. Esta informacin no tiene como fin reemplazar el consejo   del mdico. Asegrese de hacerle al mdico cualquier pregunta que tenga. Document Released: 05/06/2007 Document Revised: 08/08/2015 Document Reviewed: 12/10/2013 Elsevier Interactive Patient Education  2018 Elsevier Inc.  

## 2017-10-08 NOTE — Telephone Encounter (Signed)
Left message in voice mail of mobile phone return to the office to pick work note. The note will be at the front check in.

## 2017-10-08 NOTE — Progress Notes (Signed)
Jacob Dixon 57 y.o.   Chief Complaint  Patient presents with  . Dysuria    follow up - per patient not better still hurts to pee pee after 5 days of taking medicine    HISTORY OF PRESENT ILLNESS: This is a 57 y.o. male Seen by me 10/04/2017 with dysuriaand found to have a UTI.  Started on Cipro 500 mg twice a day but no better today.  Denies fever or chills.  Still burning with urination.  Denies nausea vomiting.  Complains of lower abdominal pain mostly left-sided and suprapubic area.  No other significant symptoms.  HPI   Prior to Admission medications   Medication Sig Start Date End Date Taking? Authorizing Provider  ciprofloxacin (CIPRO) 500 MG tablet Take 1 tablet (500 mg total) by mouth 2 (two) times daily for 7 days. 10/04/17 10/11/17 Yes Carrina Schoenberger, Ines Bloomer, MD  hydrocortisone (ANUSOL-HC) 2.5 % rectal cream Place 1 application rectally 2 (two) times daily. 10/04/17   Horald Pollen, MD  indomethacin (INDOCIN) 50 MG capsule Take 1 capsule (50 mg total) by mouth 3 (three) times daily as needed (for gout flair). Patient not taking: Reported on 01/26/2016 12/24/15   Shawnee Knapp, MD  meloxicam (MOBIC) 15 MG tablet Take 15 mg by mouth daily.    [provider]  omeprazole (PRILOSEC) 20 MG capsule Take 1 capsule (20 mg total) by mouth daily. Patient not taking: Reported on 01/26/2016 12/24/15   Shawnee Knapp, MD    No Known Allergies  There are no active problems to display for this patient.   Past Medical History:  Diagnosis Date  . Allergy   . Anemia   . Arthritis   . Neuromuscular disorder ([place])     No past surgical history on file.  Social History   Socioeconomic History  . Marital status: Married    Spouse name: Not on file  . Number of children: Not on file  . Years of education: Not on file  . Highest education level: Not on file  Occupational History  . Not on file  Social Needs  . Financial resource strain: Not on file  . Food insecurity:     Worry: Not on file    Inability: Not on file  . Transportation needs:    Medical: Not on file    Non-medical: Not on file  Tobacco Use  . Smoking status: Never Smoker  . Smokeless tobacco: Never Used  Substance and Sexual Activity  . Alcohol use: No  . Drug use: No  . Sexual activity: Not on file  Lifestyle  . Physical activity:    Days per week: Not on file    Minutes per session: Not on file  . Stress: Not on file  Relationships  . Social connections:    Talks on phone: Not on file    Gets together: Not on file    Attends religious service: Not on file    Active member of club or organization: Not on file    Attends meetings of clubs or organizations: Not on file    Relationship status: Not on file  . Intimate partner violence:    Fear of current or ex partner: Not on file    Emotionally abused: Not on file    Physically abused: Not on file    Forced sexual activity: Not on file  Other Topics Concern  . Not on file  Social History Narrative  . Not on file  Family History  Problem Relation Age of Onset  . Cancer Mother      Review of Systems  Constitutional: Negative.  Negative for chills and fever.  HENT: Negative.  Negative for sore throat.   Eyes: Negative.   Respiratory: Negative.  Negative for cough and shortness of breath.   Cardiovascular: Negative.  Negative for chest pain and palpitations.  Gastrointestinal: Positive for abdominal pain.  Genitourinary: Positive for dysuria. Negative for flank pain and hematuria.  Musculoskeletal: Negative for back pain, myalgias and neck pain.  Skin: Negative.  Negative for rash.  Neurological: Negative.  Negative for dizziness and headaches.  Endo/Heme/Allergies: Negative.   All other systems reviewed and are negative.   Vitals:   10/08/17 1448  BP: 106/64  Pulse: 92  Resp: 16  Temp: 98.4 F (36.9 C)  SpO2: 99%    Physical Exam  Constitutional: He appears well-developed.  HENT:  Head:  Normocephalic and atraumatic.  Eyes: Pupils are equal, round, and reactive to light. EOM are normal.  Neck: Normal range of motion. Neck supple.  Cardiovascular: Normal rate and regular rhythm.  Pulmonary/Chest: Effort normal and breath sounds normal.  Abdominal: Soft. Bowel sounds are normal. He exhibits no distension, no pulsatile midline mass and no mass. There is no hepatosplenomegaly. There is tenderness in the suprapubic area and left lower quadrant. There is no rigidity, no guarding, no tenderness at McBurney's point and negative Murphy's sign. No hernia.  Vitals reviewed.  Results for orders placed or performed in visit on 10/08/17 (from the past 24 hour(s))  POCT CBC     Status: Abnormal   Collection Time: 10/08/17  3:13 PM  Result Value Ref Range   WBC 7.9 4.6 - 10.2 K/uL   Lymph, poc 1.9 0.6 - 3.4   POC LYMPH PERCENT 24.2 10 - 50 %L   MID (cbc) 0.5 0 - 0.9   POC MID % 6.7 0 - 12 %M   POC Granulocyte 5.5 2 - 6.9   Granulocyte percent 69.1 37 - 80 %G   RBC 5.14 4.69 - 6.13 M/uL   Hemoglobin 16.1 14.1 - 18.1 g/dL   HCT, POC 48.3 43.5 - 53.7 %   MCV 93.9 80 - 97 fL   MCH, POC 31.3 (A) 27 - 31.2 pg   MCHC 33.3 31.8 - 35.4 g/dL   RDW, POC 12.0 %   Platelet Count, POC 267 142 - 424 K/uL   MPV 6.3 0 - 99.8 fL  POCT urinalysis dipstick     Status: Normal   Collection Time: 10/08/17  3:20 PM  Result Value Ref Range   Color, UA yellow yellow   Clarity, UA clear clear   Glucose, UA negative negative mg/dL   Bilirubin, UA negative negative   Ketones, POC UA negative negative mg/dL   Spec Grav, UA 1.010 1.010 - 1.025   Blood, UA negative negative   pH, UA 7.0 5.0 - 8.0   Protein Ur, POC negative negative mg/dL   Urobilinogen, UA 0.2 0.2 or 1.0 E.U./dL   Nitrite, UA Negative Negative   Leukocytes, UA Negative Negative  POCT Microscopic Urinalysis ([facility])     Status: Abnormal   Collection Time: 10/08/17  3:34 PM  Result Value Ref Range   WBC,UR,HPF,POC None None WBC/hpf    RBC,UR,HPF,POC None None RBC/hpf   Bacteria Few (A) None, Too numerous to count   Mucus Absent Absent   Epithelial Cells, UR Per Microscopy None None, Too numerous to count cells/hpf  A total of 25 minutes was spent in the room with the patient, greater than 50% of which was in counseling/coordination of care regarding differential diagnosis, management, medications, and need for follow-up.   ASSESSMENT & PLAN: Massimiliano was seen today for dysuria.  Diagnoses and all orders for this visit:  Dysuria -     POCT CBC -     POCT Microscopic Urinalysis ([facility]) -     POCT urinalysis dipstick -     cefadroxil (DURICEF) 500 MG capsule; Take 1 capsule (500 mg total) by mouth 2 (two) times daily for 7 days. -     phenazopyridine (PYRIDIUM) 200 MG tablet; Take 1 tablet (200 mg total) by mouth 3 (three) times daily as needed for pain. -     cefTRIAXone (ROCEPHIN) injection 1 g  Lower abdominal pain  Rectal pain -     hydrocortisone (ANUSOL-HC) 2.5 % rectal cream; Place 1 application rectally 2 (two) times daily.   Patient Instructions       IF you received an x-ray today, you will receive an invoice from [facility]. Please contact [facility] at [phone] with questions or concerns regarding your invoice.   IF you received labwork today, you will receive an invoice from [facility]. Please contact [facility] at [phone] with questions or concerns regarding your invoice.   Our billing staff will not be able to assist you with questions regarding bills from these companies.  You will be contacted with the lab results as soon as they are available. The fastest way to get your results is to activate your My Chart account. Instructions are located on the last page of this paperwork. If you have not heard from [place] regarding the results in 2 weeks, please contact this office.    Disuria (Dysuria) La disuria es dolor o molestia al [employer]. El dolor o la molestia se pueden  sentir en el conducto que transporta la orina fuera de la vejiga (uretra) o en el tejido que rodea los genitales. El dolor tambin se puede sentir en la zona de la ingle y en la parte inferior del abdomen y de la espalda. Quizs tenga que orinar con frecuencia o la sensacin repentina de tener que orinar (tenesmo vesical). La disuria puede afectar tanto a hombres como a mujeres, pero es ms comn en las mujeres. La causa puede deberse a muchos problemas diferentes:  Infeccin en las vas urinarias en mujeres.  Infeccin en los riones o la vejiga.  Clculos en los riones o la vejiga.  Ciertas enfermedades de transmisin sexual (ETS), como la clamidia.  Deshidratacin.  Inflamacin de la vagina.  Uso de ciertos medicamentos.  Uso de ciertos jabones o productos perfumados que provocan irritacin. INSTRUCCIONES PARA EL CUIDADO EN EL HOGAR Controle su disuria para ver si hay cambios. Las siguientes indicaciones pueden ayudar a [employer] pueda sentir:  Beba suficiente lquido para Theatre manager la orina clara o de color amarillo plido.  Vace la vejiga con frecuencia. Evite retener la orina durante largos perodos.  Despus de defecar, las mujeres deben limpiarse desde adelante hacia atrs, usando el papel higinico solo [place].  Vace la vejiga despus de Clinical biochemist.  Tome los medicamentos solamente como se lo haya indicado el mdico.  Si le recetaron antibiticos, asegrese de terminarlos, incluso si comienza a sentirse mejor.  Evite la cafena, el t y el alcohol. Estos productos pueden Scientist, research (medical) vejiga y Actuary disuria. En los hombres, el alcohol puede irritar la  prstata.  Concurra a todas las visitas de control como se lo haya indicado el mdico. Esto es importante.  Si le realizaron pruebas para Product manager causa de la disuria, es su responsabilidad retirar los [place]. Consulte en el laboratorio o en el departamento en el que fue  realizado el estudio cundo y cmo podr [employer]. Hable con el mdico si tiene [employer]. SOLICITE ATENCIN MDICA SI:  Siente dolor en la espalda o a los costados del cuerpo.  Tiene fiebre.  Tiene nuseas o vmitos.  Observa sangre en la orina.  Est orinando con ms frecuencia que lo habitual. SOLICITE ATENCIN MDICA DE INMEDIATO SI:  El dolor es intenso y no se alivia con los medicamentos.  No puede retener lquido.  Usted u otra persona advierten algn cambio en su funcin mental.  Tiene una frecuencia cardaca acelerada en reposo.  Tiene temblores o escalofros.  Se siente muy dbil. Esta informacin no tiene Marine scientist el consejo del mdico. Asegrese de hacerle al mdico cualquier pregunta que tenga. Document Released: 05/06/2007 Document Revised: 08/08/2015 Document Reviewed: 12/10/2013 Elsevier Interactive Patient Education  2018 Elsevier Inc.      Agustina Caroli, MD [facility]

## 2017-10-09 NOTE — Telephone Encounter (Signed)
Will pick up at appointment on 10/11/2017.

## 2017-10-11 ENCOUNTER — Other Ambulatory Visit: Payer: Self-pay

## 2017-10-11 ENCOUNTER — Emergency Department (HOSPITAL_COMMUNITY)
Admission: EM | Admit: 2017-10-11 | Discharge: 2017-10-12 | Disposition: A | Discharge status: Home / Self Care | Payer: 59 | Attending: Emergency Medicine | Admitting: Emergency Medicine

## 2017-10-11 ENCOUNTER — Encounter: Payer: Self-pay | Admitting: Emergency Medicine

## 2017-10-11 ENCOUNTER — Emergency Department (HOSPITAL_COMMUNITY): Payer: 59

## 2017-10-11 ENCOUNTER — Ambulatory Visit: Payer: 59 | Admitting: Emergency Medicine

## 2017-10-11 ENCOUNTER — Encounter (HOSPITAL_COMMUNITY): Payer: Self-pay | Admitting: Emergency Medicine

## 2017-10-11 VITALS — BP 122/68 | HR 92 | Temp 98.6°F | Resp 16 | Ht 60.0 in | Wt 156.0 lb

## 2017-10-11 DIAGNOSIS — R109 Unspecified abdominal pain: Secondary | ICD-10-CM | POA: Diagnosis not present

## 2017-10-11 DIAGNOSIS — R1030 Lower abdominal pain, unspecified: Secondary | ICD-10-CM

## 2017-10-11 DIAGNOSIS — I861 Scrotal varices: Secondary | ICD-10-CM | POA: Diagnosis not present

## 2017-10-11 DIAGNOSIS — R300 Dysuria: Secondary | ICD-10-CM

## 2017-10-11 DIAGNOSIS — R1032 Left lower quadrant pain: Secondary | ICD-10-CM | POA: Diagnosis not present

## 2017-10-11 LAB — URINALYSIS, ROUTINE W REFLEX MICROSCOPIC
Bacteria, UA: NONE SEEN
Bilirubin Urine: NEGATIVE
GLUCOSE, UA: NEGATIVE mg/dL
HGB URINE DIPSTICK: NEGATIVE
Ketones, ur: NEGATIVE mg/dL
Nitrite: NEGATIVE
Protein, ur: NEGATIVE mg/dL
SPECIFIC GRAVITY, URINE: 1.008 (ref 1.005–1.030)
pH: 7 (ref 5.0–8.0)

## 2017-10-11 NOTE — ED Triage Notes (Addendum)
Patient sent to ED from [facility] for abd pain - he reports lower abd pain and penis as well as burning sensation with urination x 2 weeks. He denies fevers/chills. Also reports increased bowel movements - normally goes 3-4 times per day, but has been going about 5 times per day. Denies N/V/D, no fevers/chills. Pt adds that he has been taking antibiotic x 5 days without relief.

## 2017-10-11 NOTE — Patient Instructions (Addendum)
To the emergency room now for further evaluation and treatment. Dolor abdominal en los adultos Abdominal Pain, Adult El dolor de [place] (abdominal) puede tener muchas causas. [place] veces, el dolor de [place] no es peligroso. Muchos de [employer] de dolor de estmago pueden controlarse y tratarse en casa. Sin embargo, a Clinical cytogeneticist, Conservation officer, historic buildings de [employer] puede ser grave. El mdico intentar descubrir la causa del dolor de [place]. Siga estas indicaciones en su casa:  Tome los medicamentos de venta libre y los recetados solamente como se lo haya indicado el mdico. No tome medicamentos que lo ayuden a Landscape architect (laxantes), salvo que el mdico se lo indique.  Beba suficiente lquido para mantener el pis (orina) claro o de color amarillo plido.  Est atento al dolor de estmago para Actuary cambio.  Concurra a todas las visitas de control como se lo haya indicado el mdico. Esto es importante. Comunquese con un mdico si:  El dolor de estmago cambia o [place].  No tiene apetito o baja de peso sin proponrselo.  Tiene dificultades para defecar (est estreido) o heces lquidas (diarrea) durante ms de 2 o 3das.  Siente dolor al orinar o defecar.  El dolor de estmago lo despierta de noche.  El dolor empeora con las comidas, despus de comer o con determinados alimentos.  Vomita y no puede retener nada de lo que ingiere.  Tiene fiebre. Solicite ayuda de inmediato si:  El dolor no desaparece en el tiempo indicado por el mdico.  No puede detener los vmitos.  Siente dolor solamente en zonas especficas del abdomen, como el lado derecho o la parte inferior izquierda.  Tiene heces con sangre, de color negro o con aspecto alquitranado.  Tiene dolor muy intenso en el vientre, clicos o meteorismo.  Presenta signos de no tener suficientes lquidos o agua en el cuerpo (deshidratacin), por ejemplo: ? [place] es [place], es muy escasa o no orina. ? Labios  agrietados. ? [employer]. ? Ojos hundidos. ? Somnolencia. ? Debilidad. Esta informacin no tiene Marine scientist el consejo del mdico. Asegrese de hacerle al mdico cualquier pregunta que tenga. Document Released: 07/13/2008 Document Revised: 07/11/2016 Document Reviewed: 09/28/2015 Elsevier Interactive Patient Education  2018 [employer].     IF you received an x-ray today, you will receive an invoice from [facility]. Please contact [facility] at [phone] with questions or concerns regarding your invoice.   IF you received labwork today, you will receive an invoice from [place]. Please contact [facility] at [phone] with questions or concerns regarding your invoice.   Our billing staff will not be able to assist you with questions regarding bills from these companies.  You will be contacted with the lab results as soon as they are available. The fastest way to get your results is to activate your My Chart account. Instructions are located on the last page of this paperwork. If you have not heard from [place] regarding the results in 2 weeks, please contact this office.

## 2017-10-11 NOTE — ED Provider Notes (Signed)
Patient placed in Quick Look pathway, seen and evaluated   Chief Complaint: Urinary discomfort  HPI:   Presents with burning discomfort in the lower abdomen, penis, and left testicle for the past few days.  States burning sensation with urination began about 2 weeks ago.  Patient states he was treated with 5 days of an antibiotic, will with the final dose taken today, for treatment of UTI with no improvement.  Denies fever, penile discharge, hematuria, diarrhea, nausea/vomiting, hematochezia/melena.  ROS: Dysuria (one)  Physical Exam:   Gen: No distress  Neuro: Awake and Alert  Skin: Warm    Focused Exam:   No diaphoresis.  No pallor.  Pulmonary: No increased work of breathing.  Speaks in full sentences without difficulty.  No tachypnea.  Cardiac: Normal rate and regular. Peripheral pulses intact.  Abdominal: Questionable suprapubic tenderness.  No peritoneal signs.  No rebound tenderness.  No guarding.  No CVA tenderness.  GU: Tenderness to the left testicle without noted swelling.  Testicles do not extend into the bottom of the scrotum, rather appear to be pulled close to the body with loose scrotal skin underneath.  Penis without swelling, lesions, or tenderness. No penile discharge.  No inguinal hernia noted. No inguinal lymphadenopathy. RN, Jerene Pitch, served as Producer, television/film/video during the exam.  Spanish interpreter used during interview.  Initiation of care has begun. The patient has been counseled on the process, plan, and necessity for staying for the completion/evaluation, and the remainder of the medical screening examination   Layla Maw 10/11/17 1814    Lacretia Leigh, MD 10/13/17 [phone]

## 2017-10-11 NOTE — Progress Notes (Signed)
Jacob Dixon 57 y.o.   Chief Complaint  Patient presents with  . Abdominal Pain    pt states since last OV he still have the same  symptoms and does not feel any better     HISTORY OF PRESENT ILLNESS: This is a 57 y.o. male complaining of lower abdominal and pelvic pain with burning with urination.  Seen by me 10/04/2017 and 10/08/2017 for the same.  Was started on antibiotics and Pyridium with little improvement.  No new symptoms just no improvement.  HPI   Prior to Admission medications   Medication Sig Start Date End Date Taking? Authorizing Provider  cefadroxil (DURICEF) 500 MG capsule Take 1 capsule (500 mg total) by mouth 2 (two) times daily for 7 days. 10/08/17 10/15/17 Yes Jacob Dixon  ciprofloxacin (CIPRO) 500 MG tablet Take 1 tablet (500 mg total) by mouth 2 (two) times daily for 7 days. 10/04/17 10/11/17 Yes Jacob Dixon  hydrocortisone (ANUSOL-HC) 2.5 % rectal cream Place 1 application rectally 2 (two) times daily. 10/04/17  Yes Jacob Dixon  hydrocortisone (ANUSOL-HC) 2.5 % rectal cream Place 1 application rectally 2 (two) times daily. 10/08/17  Yes Jacob Dixon  indomethacin (INDOCIN) 50 MG capsule Take 1 capsule (50 mg total) by mouth 3 (three) times daily as needed (for gout flair). 12/24/15  Yes Jacob Dixon  meloxicam (MOBIC) 15 MG tablet Take 15 mg by mouth daily.   Yes Provider, Historical, Dixon  omeprazole (PRILOSEC) 20 MG capsule Take 1 capsule (20 mg total) by mouth daily. 12/24/15  Yes Jacob Dixon  phenazopyridine (PYRIDIUM) 200 MG tablet Take 1 tablet (200 mg total) by mouth 3 (three) times daily as needed for pain. 10/08/17  Yes Jacob Dixon    No Known Allergies  Patient Active Problem List   Diagnosis Date Noted  . Dysuria 10/08/2017  . Lower abdominal pain 10/08/2017  . Rectal pain 10/08/2017    Past Medical History:  Diagnosis Date  . Allergy   . Anemia   . Arthritis   .  Neuromuscular disorder ([place])     History reviewed. No pertinent surgical history.  Social History   Socioeconomic History  . Marital status: Married    Spouse name: Not on file  . Number of children: Not on file  . Years of education: Not on file  . Highest education level: Not on file  Occupational History  . Not on file  Social Needs  . Financial resource strain: Not on file  . Food insecurity:    Worry: Not on file    Inability: Not on file  . Transportation needs:    Medical: Not on file    Non-medical: Not on file  Tobacco Use  . Smoking status: Never Smoker  . Smokeless tobacco: Never Used  Substance and Sexual Activity  . Alcohol use: No  . Drug use: No  . Sexual activity: Not on file  Lifestyle  . Physical activity:    Days per week: Not on file    Minutes per session: Not on file  . Stress: Not on file  Relationships  . Social connections:    Talks on phone: Not on file    Gets together: Not on file    Attends religious service: Not on file    Active member of club or organization: Not on file    Attends meetings of clubs or organizations: Not on file  Relationship status: Not on file  . Intimate partner violence:    Fear of current or ex partner: Not on file    Emotionally abused: Not on file    Physically abused: Not on file    Forced sexual activity: Not on file  Other Topics Concern  . Not on file  Social History Narrative  . Not on file    Family History  Problem Relation Age of Onset  . Cancer Mother      Review of Systems  Constitutional: Negative.  Negative for chills and fever.  HENT: Negative.  Negative for sore throat.   Respiratory: Negative.  Negative for cough and shortness of breath.   Cardiovascular: Negative.  Negative for chest pain and palpitations.  Gastrointestinal: Positive for abdominal pain. Negative for blood in stool, diarrhea and nausea.  Genitourinary: Positive for dysuria.  Musculoskeletal: Negative.   Skin:  Negative.  Negative for rash.  Neurological: Negative.  Negative for dizziness and headaches.  Endo/Heme/Allergies: Negative.   All other systems reviewed and are negative.  Vitals:   10/11/17 1444  BP: 122/68  Pulse: 92  Resp: 16  Temp: 98.6 F (37 C)  SpO2: 98%     Physical Exam  Constitutional: He is oriented to person, place, and time. He appears well-developed and well-nourished.  HENT:  Head: Normocephalic and atraumatic.  Nose: Nose normal.  Mouth/Throat: Oropharynx is clear and moist.  Eyes: Pupils are equal, round, and reactive to light. Conjunctivae and EOM are normal.  Neck: Normal range of motion. Neck supple.  Cardiovascular: Normal rate and regular rhythm.  Pulmonary/Chest: Effort normal and breath sounds normal.  Abdominal: Soft. Bowel sounds are normal. He exhibits no distension and no mass. There is no hepatosplenomegaly. There is tenderness in the suprapubic area and left lower quadrant. There is no rebound, no guarding and no CVA tenderness. No hernia.  Musculoskeletal: Normal range of motion.  Neurological: He is alert and oriented to person, place, and time.  Skin: Skin is warm and dry. Capillary refill takes less than 2 seconds.  Psychiatric: He has a normal mood and affect. His behavior is normal.  Vitals reviewed.    ASSESSMENT & PLAN: Jacob Dixon was seen today for abdominal pain.  Diagnoses and all orders for this visit:  Lower abdominal pain   Needs further evaluation and treatment in the emergency room now.  Patient Instructions   To the emergency room now for further evaluation and treatment. Dolor abdominal en los adultos Abdominal Pain, Adult El dolor de [place] Ruiz (abdominal) puede tener muchas causas. [place] veces, el dolor de [place] no es peligroso. Muchos de [employer] de dolor de estmago pueden controlarse y tratarse en casa. Sin embargo, a Clinical cytogeneticist, Conservation officer, historic buildings de [employer] puede ser grave. El mdico intentar descubrir la  causa del dolor de [place]. Siga estas indicaciones en su casa:  Tome los medicamentos de venta libre y los recetados solamente como se lo haya indicado el mdico. No tome medicamentos que lo ayuden a Landscape architect (laxantes), salvo que el mdico se lo indique.  Beba suficiente lquido para mantener el pis (orina) claro o de color amarillo plido.  Est atento al dolor de estmago para Actuary cambio.  Concurra a todas las visitas de control como se lo haya indicado el mdico. Esto es importante. Comunquese con un mdico si:  El dolor de estmago cambia o [place].  No tiene apetito o baja de peso sin proponrselo.  Tiene dificultades para defecar (est  estreido) o heces lquidas (diarrea) durante ms de 2 o 3das.  Siente dolor al orinar o defecar.  El dolor de estmago lo despierta de noche.  El dolor empeora con las comidas, despus de comer o con determinados alimentos.  Vomita y no puede retener nada de lo que ingiere.  Tiene fiebre. Solicite ayuda de inmediato si:  El dolor no desaparece en el tiempo indicado por el mdico.  No puede detener los vmitos.  Siente dolor solamente en zonas especficas del abdomen, como el lado derecho o la parte inferior izquierda.  Tiene heces con sangre, de color negro o con aspecto alquitranado.  Tiene dolor muy intenso en el vientre, clicos o meteorismo.  Presenta signos de no tener suficientes lquidos o agua en el cuerpo (deshidratacin), por ejemplo: ? [place] es [place], es muy escasa o no orina. ? Labios agrietados. ? [employer]. ? Ojos hundidos. ? Somnolencia. ? Debilidad. Esta informacin no tiene Marine scientist el consejo del mdico. Asegrese de hacerle al mdico cualquier pregunta que tenga. Document Released: 07/13/2008 Document Revised: 07/11/2016 Document Reviewed: 09/28/2015 Elsevier Interactive Patient Education  2018 [employer].     IF you received an x-ray today, you will receive an invoice  from [facility]. Please contact [facility] at [phone] with questions or concerns regarding your invoice.   IF you received labwork today, you will receive an invoice from [facility]. Please contact [facility] at [phone] with questions or concerns regarding your invoice.   Our billing staff will not be able to assist you with questions regarding bills from these companies.  You will be contacted with the lab results as soon as they are available. The fastest way to get your results is to activate your My Chart account. Instructions are located on the last page of this paperwork. If you have not heard from [place] regarding the results in 2 weeks, please contact this office.          Agustina Caroli, [facility]

## 2017-10-12 ENCOUNTER — Emergency Department (HOSPITAL_COMMUNITY): Payer: 59

## 2017-10-12 DIAGNOSIS — R109 Unspecified abdominal pain: Secondary | ICD-10-CM | POA: Diagnosis not present

## 2017-10-12 LAB — COMPREHENSIVE METABOLIC PANEL
ALK PHOS: 62 U/L (ref 38–126)
ALT: 32 U/L (ref 17–63)
AST: 21 U/L (ref 15–41)
Albumin: 4.2 g/dL (ref 3.5–5.0)
Anion gap: 8 (ref 5–15)
BUN: 12 mg/dL (ref 6–20)
CALCIUM: 9.1 mg/dL (ref 8.9–10.3)
CO2: 29 mmol/L (ref 22–32)
CREATININE: 0.89 mg/dL (ref 0.61–1.24)
Chloride: 99 mmol/L — ABNORMAL LOW (ref 101–111)
Glucose, Bld: 123 mg/dL — ABNORMAL HIGH (ref 65–99)
Potassium: 3.2 mmol/L — ABNORMAL LOW (ref 3.5–5.1)
SODIUM: 136 mmol/L (ref 135–145)
Total Bilirubin: 0.9 mg/dL (ref 0.3–1.2)
Total Protein: 7.2 g/dL (ref 6.5–8.1)

## 2017-10-12 LAB — CBC WITH DIFFERENTIAL/PLATELET
ABS IMMATURE GRANULOCYTES: 0.1 10*3/uL (ref 0.0–0.1)
Basophils Absolute: 0.1 10*3/uL (ref 0.0–0.1)
Basophils Relative: 1 %
Eosinophils Absolute: 0.1 10*3/uL (ref 0.0–0.7)
Eosinophils Relative: 1 %
HCT: 46.8 % (ref 39.0–52.0)
HEMOGLOBIN: 16.6 g/dL (ref 13.0–17.0)
Immature Granulocytes: 1 %
LYMPHS ABS: 3.2 10*3/uL (ref 0.7–4.0)
LYMPHS PCT: 35 %
MCH: 31.3 pg (ref 26.0–34.0)
MCHC: 35.5 g/dL (ref 30.0–36.0)
MCV: 88.1 fL (ref 78.0–100.0)
MONO ABS: 0.8 10*3/uL (ref 0.1–1.0)
Monocytes Relative: 9 %
NEUTROS ABS: 4.8 10*3/uL (ref 1.7–7.7)
Neutrophils Relative %: 53 %
Platelets: 258 10*3/uL (ref 150–400)
RBC: 5.31 MIL/uL (ref 4.22–5.81)
RDW: 11.3 % — ABNORMAL LOW (ref 11.5–15.5)
WBC: 9 10*3/uL (ref 4.0–10.5)

## 2017-10-12 MED ORDER — CEFADROXIL 500 MG PO CAPS: 500.0000 mg | ORAL_CAPSULE | Freq: Two times a day (BID) | ORAL | 0 refills | Status: AC

## 2017-10-12 MED ORDER — FENTANYL CITRATE (PF) 100 MCG/2ML IJ SOLN
50.0000 ug | Freq: Once | INTRAMUSCULAR | Status: AC
  Administered 2017-10-12: 50 ug via INTRAVENOUS
  Filled 2017-10-12: qty 2

## 2017-10-12 MED ORDER — POTASSIUM CHLORIDE CRYS ER 20 MEQ PO TBCR
40.0000 meq | EXTENDED_RELEASE_TABLET | Freq: Once | ORAL | Status: AC
  Administered 2017-10-12: 40 meq via ORAL
  Filled 2017-10-12: qty 2

## 2017-10-12 MED ORDER — IOHEXOL 300 MG/ML  SOLN
100.0000 mL | Freq: Once | INTRAMUSCULAR | Status: AC | PRN
  Administered 2017-10-12: 100 mL via INTRAVENOUS

## 2017-10-12 NOTE — ED Provider Notes (Signed)
Emergency Department Provider Note   I have reviewed the triage vital signs and the nursing notes.   HISTORY  Chief Complaint Abdominal Pain   HPI Jacob Dixon is a 57 y.o. male without significant past medical history the presents to the emergency department today with persistent abdominal pain and penile pain along with intermittent scrotal pain.  Patient states is been going on for 2 weeks was on ciprofloxacin for 5 days and did not improve so they started him on a cephalosporin which is been a 5 or 6 days he has not seem to be improving so he saw his primary doctor who sent him here for further evaluation.  Patient did seem to have bacteriuria couple times based on the records.  Does state some pain with bowel movements.  No blood in his stool.  No fevers, nausea or vomiting.  No diarrhea or constipation but does have increased frequency of stools. No other associated or modifying symptoms.    Past Medical History:  Diagnosis Date  . Allergy   . Anemia   . Arthritis   . Neuromuscular disorder [facility])     Patient Active Problem List   Diagnosis Date Noted  . Dysuria 10/08/2017  . Lower abdominal pain 10/08/2017  . Rectal pain 10/08/2017    History reviewed. No pertinent surgical history.  Current Outpatient Rx  . Order #: [id number] Class: Normal  . Order #: [id number] Class: Print    Allergies Patient has no known allergies.  Family History  Problem Relation Age of Onset  . Cancer Mother     Social History Social History   Tobacco Use  . Smoking status: Never Smoker  . Smokeless tobacco: Never Used  Substance Use Topics  . Alcohol use: No  . Drug use: No    Review of Systems  All other systems negative except as documented in the HPI. All pertinent positives and negatives as reviewed in the HPI. ____________________________________________   PHYSICAL EXAM:  VITAL SIGNS: ED Triage Vitals  Enc Vitals Group     BP 10/11/17 1706 123/88   Pulse Rate 10/11/17 1706 88     Resp 10/11/17 1706 14     Temp 10/11/17 1706 98.6 F (37 C)     Temp Source 10/11/17 1706 Oral     SpO2 10/11/17 1706 99 %     Weight 10/11/17 1707 156 lb (70.8 kg)     Height 10/11/17 1707 5' (1.524 m)    Constitutional: Alert and oriented. Well appearing and in no acute distress. Eyes: Conjunctivae are normal. PERRL. EOMI. Head: Atraumatic. Nose: No congestion/rhinnorhea. Mouth/Throat: Mucous membranes are moist.  Oropharynx non-erythematous. Neck: No stridor.  No meningeal signs.   Cardiovascular: Normal rate, regular rhythm. Good peripheral circulation. Grossly normal heart sounds.   Respiratory: Normal respiratory effort.  No retractions. Lungs CTAB. Gastrointestinal: Soft and nontender. No distention.  GU:  Chaperoned by PA student, Chrys Racer. Prostate tenderness and firmness.  Musculoskeletal: No lower extremity tenderness nor edema. No gross deformities of extremities. Neurologic:  Normal speech and language. No gross focal neurologic deficits are appreciated.  Skin:  Skin is warm, dry and intact. No rash noted.   ____________________________________________   LABS (all labs ordered are listed, but only abnormal results are displayed)  Labs Reviewed  URINALYSIS, ROUTINE W REFLEX MICROSCOPIC - Abnormal; Notable for the following components:      Result Value   Leukocytes, UA SMALL (*)    All other components within normal limits  CBC WITH DIFFERENTIAL/PLATELET -  Abnormal; Notable for the following components:   RDW 11.3 (*)    All other components within normal limits  COMPREHENSIVE METABOLIC PANEL - Abnormal; Notable for the following components:   Potassium 3.2 (*)    Chloride 99 (*)    Glucose, Bld 123 (*)    All other components within normal limits  URINE CULTURE  GC/CHLAMYDIA PROBE AMP ([place]) NOT AT [facility]   ______________________________________________  RADIOLOGY  Ct Abdomen Pelvis W Contrast  Result Date:  10/12/2017 CLINICAL DATA:  Acute abdominal pain. EXAM: CT ABDOMEN AND PELVIS WITH CONTRAST TECHNIQUE: Multidetector CT imaging of the abdomen and pelvis was performed using the standard protocol following bolus administration of intravenous contrast. CONTRAST:  131mL OMNIPAQUE IOHEXOL 300 MG/ML  SOLN COMPARISON:  None. FINDINGS: Lower chest: No pleural fluid or consolidation. Hepatobiliary: Few small subcentimeter hepatic hypodensities are too small to characterize but likely small cysts or hemangiomas. Gallbladder physiologically distended, no calcified stone. No biliary dilatation. Pancreas: No ductal dilatation or inflammation. Spleen: Normal in size without focal abnormality. Adrenals/Urinary Tract: Normal adrenal glands. No hydronephrosis or perinephric edema. Homogeneous renal enhancement with symmetric excretion on delayed phase imaging. Multiple low-density lesions throughout both kidneys, majority are subcentimeter and too small to characterize, larger lesions in each kidney represent simple cysts. No suspicious renal lesion. Urinary bladder is distended without wall thickening. Stomach/Bowel: Stomach is within normal limits. Appendix appears normal. No evidence of bowel wall thickening, distention, or inflammatory changes. Minimal diverticulosis of the descending colon without diverticulitis. Moderate stool burden. Vascular/Lymphatic: Normal caliber abdominal aorta. There is a retroaortic left renal vein. No enlarged abdominal or pelvic lymph nodes. Reproductive: Normal sized prostate gland with prostatic calcifications. Other: No free air, free fluid, or intra-abdominal fluid collection. Tiny fat containing umbilical hernia. Musculoskeletal: There are no acute or suspicious osseous abnormalities. IMPRESSION: 1. No acute findings in the abdomen/pelvis. 2. Minimal diverticulosis without diverticulitis. Moderate colonic stool burden without bowel obstruction or inflammation. 3. Tiny fat containing umbilical  hernia. Electronically Signed   By: Jeb Levering M.D.   On: 10/12/2017 04:39   US Scrotum W/doppler  Result Date: 10/11/2017 CLINICAL DATA:  Initial evaluation for acute left testicular pain with abdominal pain. EXAM: SCROTAL ULTRASOUND DOPPLER ULTRASOUND OF THE TESTICLES TECHNIQUE: Complete ultrasound examination of the testicles, epididymis, and other scrotal structures was performed. Color and spectral Doppler ultrasound were also utilized to evaluate blood flow to the testicles. COMPARISON:  None. FINDINGS: Right testicle Measurements: 3.5 x 2.2 x 3.3 cm. No mass or microlithiasis visualized. Left testicle Measurements: 3.5 x 2.0 x 2.9 cm. No mass or microlithiasis visualized. Right epididymis:  Normal in size and appearance. Left epididymis:  Normal in size and appearance. Hydrocele:  Trace bilateral hydroceles noted. Varicocele:  Small left-sided varicocele. Pulsed Doppler interrogation of both testes demonstrates normal low resistance arterial and venous waveforms bilaterally. IMPRESSION: 1. Small left-sided varicocele, which could result in left-sided pain. 2. No other acute abnormality identified. Electronically Signed   By: Jeannine Boga M.D.   On: 10/11/2017 20:46    ____________________________________________      INITIAL IMPRESSION / ASSESSMENT AND PLAN / ED COURSE  CT done today showed no abscess or other pelvic pathology secondary to duration of symptoms.  Patient likely needs a prolonged course of antibiotics and urology follow-up.  Will extend those and asked her to call for an appointment.     Pertinent labs & imaging results that were available during my care of the patient were reviewed by me  and considered in my medical decision making (see chart for details).  ____________________________________________  FINAL CLINICAL IMPRESSION(S) / ED DIAGNOSES  Final diagnoses:  Abdominal pain, unspecified abdominal location     MEDICATIONS GIVEN DURING THIS  VISIT:  Medications  fentaNYL (SUBLIMAZE) injection 50 mcg (50 mcg Intravenous Given 10/12/17 0255)  iohexol (OMNIPAQUE) 300 MG/ML solution 100 mL (100 mLs Intravenous Contrast Given 10/12/17 0404)  potassium chloride SA (K-DUR,KLOR-CON) CR tablet 40 mEq (40 mEq Oral Given 10/12/17 0721)     NEW OUTPATIENT MEDICATIONS STARTED DURING THIS VISIT:  Current Discharge Medication List      Note:  This note was prepared with assistance of Dragon voice recognition software. Occasional wrong-word or sound-a-like substitutions may have occurred due to the inherent limitations of voice recognition software.   Stepfon Rawles, Corene Cornea, MD 10/12/17 [phone]

## 2017-10-14 LAB — GC/CHLAMYDIA PROBE AMP (~~LOC~~) NOT AT ARMC
Chlamydia: NEGATIVE
NEISSERIA GONORRHEA: NEGATIVE

## 2017-10-15 LAB — URINE CULTURE

## 2017-10-16 ENCOUNTER — Telehealth: Payer: Self-pay | Admitting: *Deleted

## 2017-10-16 ENCOUNTER — Other Ambulatory Visit: Payer: Self-pay | Admitting: Emergency Medicine

## 2017-10-16 ENCOUNTER — Telehealth: Payer: Self-pay | Admitting: Emergency Medicine

## 2017-10-16 MED ORDER — NITROFURANTOIN MONOHYD MACRO 100 MG PO CAPS: 100.0000 mg | ORAL_CAPSULE | Freq: Two times a day (BID) | ORAL | 0 refills | Status: DC

## 2017-10-16 NOTE — Telephone Encounter (Signed)
Post ED Visit - Positive Culture Follow-up: Unsuccessful Patient Follow-up  Culture assessed and recommendations reviewed by:  [x]  Elenor Quinones, Pharm.D. []  Heide Guile, Pharm.D., BCPS AQ-ID []  Parks Neptune, Pharm.D., BCPS []  Alycia Rossetti, Pharm.D., BCPS []  [place], Pharm.D., BCPS, AAHIVP []  Legrand Como, Pharm.D., BCPS, AAHIVP []  Wynell Balloon, PharmD []  Vincenza Hews, PharmD, BCPS  Positive urine culture, reviewed by Franchot Heidelberg, PA-C  []  Patient discharged without antimicrobial prescription and treatment is now indicated [x]  Organism is resistant to prescribed ED discharge antimicrobial.  Stop Cefadroxin and start Nitrofurantoin 100mg  PO BID x 7 days, f/u with Urology []  Patient with positive blood cultures   Unable to contact patient after 3 attempts, letter will be sent to address on file  Ardeen Fillers 10/16/2017, 9:52 AM

## 2017-10-16 NOTE — Telephone Encounter (Signed)
Urine culture result discussed with patient.  Started on Macrobid twice a day.  He will follow-up with me in the office in 2 days.

## 2017-10-16 NOTE — Progress Notes (Signed)
ED Antimicrobial Stewardship Positive Culture Follow Up   Jacob Dixon is an 56 y.o. male who presented to [facility] on 10/11/2017 with a chief complaint of  Chief Complaint  Patient presents with  . Abdominal Pain    Recent Results (from the past 720 hour(s))  Urine culture     Status: Abnormal   Collection Time: 10/11/17  9:12 PM  Result Value Ref Range Status   Specimen Description URINE, CLEAN CATCH  Final   Special Requests NONE  Final   Culture (A)  Final    30,000 COLONIES/mL ESCHERICHIA COLI Confirmed Extended Spectrum Beta-Lactamase Producer (ESBL).  In bloodstream infections from ESBL organisms, carbapenems are preferred over piperacillin/tazobactam. They are shown to have a lower risk of mortality. Performed at [facility], [place] [address] [place], [place] [address]    Report Status 10/15/2017 FINAL  Final   Organism ID, Bacteria ESCHERICHIA COLI (A)  Final      Susceptibility   Escherichia coli - MIC*    AMPICILLIN >=32 RESISTANT Resistant     CEFAZOLIN >=64 RESISTANT Resistant     CEFTRIAXONE >=64 RESISTANT Resistant     CIPROFLOXACIN >=4 RESISTANT Resistant     GENTAMICIN <=1 SENSITIVE Sensitive     IMIPENEM <=0.25 SENSITIVE Sensitive     NITROFURANTOIN <=16 SENSITIVE Sensitive     TRIMETH/SULFA >=320 RESISTANT Resistant     AMPICILLIN/SULBACTAM >=32 RESISTANT Resistant     PIP/TAZO 16 SENSITIVE Sensitive     Extended ESBL POSITIVE Resistant     * 30,000 COLONIES/mL ESCHERICHIA COLI     [x]  Treated with cefadroxil, organism resistant to prescribed antimicrobial   New antibiotic prescription: Stop cefadroxil. Start nitrofurantoin 100mg  PO BID x 7 days. Also follow up with Urology  ED Provider: [facility] PA-C  Reginia Naas 10/16/2017, 9:18 AM Infectious Diseases Pharmacist Phone# [phone]

## 2017-10-18 ENCOUNTER — Encounter: Payer: Self-pay | Admitting: Emergency Medicine

## 2017-10-18 ENCOUNTER — Ambulatory Visit: Payer: 59 | Admitting: Emergency Medicine

## 2017-10-18 ENCOUNTER — Other Ambulatory Visit: Payer: Self-pay

## 2017-10-18 VITALS — BP 102/64 | HR 82 | Temp 98.5°F | Resp 16 | Wt 156.6 lb

## 2017-10-18 DIAGNOSIS — B9629 Other Escherichia coli [E. coli] as the cause of diseases classified elsewhere: Secondary | ICD-10-CM | POA: Diagnosis not present

## 2017-10-18 DIAGNOSIS — R399 Unspecified symptoms and signs involving the genitourinary system: Secondary | ICD-10-CM | POA: Insufficient documentation

## 2017-10-18 DIAGNOSIS — Z1612 Extended spectrum beta lactamase (ESBL) resistance: Secondary | ICD-10-CM | POA: Diagnosis not present

## 2017-10-18 DIAGNOSIS — N390 Urinary tract infection, site not specified: Secondary | ICD-10-CM | POA: Diagnosis not present

## 2017-10-18 LAB — POCT URINALYSIS DIP (MANUAL ENTRY)
BILIRUBIN UA: NEGATIVE
Glucose, UA: NEGATIVE mg/dL
Ketones, POC UA: NEGATIVE mg/dL
Leukocytes, UA: NEGATIVE
NITRITE UA: NEGATIVE
PROTEIN UA: NEGATIVE mg/dL
RBC UA: NEGATIVE
Spec Grav, UA: 1.015 (ref 1.010–1.025)
UROBILINOGEN UA: 0.2 U/dL
pH, UA: 7.5 (ref 5.0–8.0)

## 2017-10-18 NOTE — Progress Notes (Signed)
Jacob Dixon 57 y.o.   Chief Complaint  Patient presents with  . Abdominal Pain    follow up     HISTORY OF PRESENT ILLNESS: This is a 57 y.o. male on 10/04/2017 with dysuria and UTI.  Urine culture grew E. coli resistant to multiple antibiotics.  Seen in the emergency room 10/11/2017 for the same due to no improvement. Normal scrotal ultrasound and also normal CT scan of abdomen and pelvis.  Blood work unremarkable.  Was started on Duricef 500 mg twice a day in the emergency room.  No improvement. Urine culture result from the ER sample showed the following: Patient is30,000 COLONIES/mL ESCHERICHIA COLI  Confirmed Extended Spectrum Beta-Lactamase Producer (ESBL). In bloodstream infections from ESBL organisms, carbapenems are preferred over piperacillin/tazobactam. They are shown to have a lower risk of mortality.  Susceptibility    Escherichia coli    MIC    AMPICILLIN >=32 RESIST... Resistant    AMPICILLIN/SULBACTAM >=32 RESIST... Resistant    CEFAZOLIN >=64 RESIST... Resistant    CEFTRIAXONE >=64 RESIST... Resistant    CIPROFLOXACIN >=4 RESISTANT  Resistant    Extended ESBL POSITIVE  Resistant    GENTAMICIN <=1 SENSITIVE  Sensitive    IMIPENEM <=0.25 SENS... Sensitive    NITROFURANTOIN <=16 SENSIT... Sensitive    PIP/TAZO 16 SENSITIVE  Sensitive    TRIMETH/SULFA >=320 RESIS... Resistant         Susceptibility Comments   Escherichia coli  30,000 COLONIES/mL ESCHERICHIA COLI    Patient started on Macrobid twice a day 2 days ago.  Reports mild improvement. CT scan of the abdomen and pelvis shows diverticulosis but no diverticulitis.  Normal prostate.  Normal bladder.   HPI   Prior to Admission medications   Medication Sig Start Date End Date Taking? Authorizing Provider  cefadroxil (DURICEF) 500 MG capsule Take 1 capsule (500 mg total) by mouth 2 (two) times daily for 7 days. In addition to previous 7 days (14 days total of treatment) 10/12/17 10/19/17 Yes Mesner,  Corene Cornea, MD  nitrofurantoin, macrocrystal-monohydrate, (MACROBID) 100 MG capsule Take 1 capsule (100 mg total) by mouth 2 (two) times daily for 7 days. 10/16/17 10/23/17 Yes Shaneece Stockburger, Ines Bloomer, MD  phenazopyridine (PYRIDIUM) 200 MG tablet Take 1 tablet (200 mg total) by mouth 3 (three) times daily as needed for pain. 10/08/17  Yes SagardiaInes Bloomer, MD    No Known Allergies  Patient Active Problem List   Diagnosis Date Noted  . Dysuria 10/08/2017  . Lower abdominal pain 10/08/2017  . Rectal pain 10/08/2017    Past Medical History:  Diagnosis Date  . Allergy   . Anemia   . Arthritis   . Neuromuscular disorder ([place])     No past surgical history on file.  Social History   Socioeconomic History  . Marital status: Married    Spouse name: Not on file  . Number of children: Not on file  . Years of education: Not on file  . Highest education level: Not on file  Occupational History  . Not on file  Social Needs  . Financial resource strain: Not on file  . Food insecurity:    Worry: Not on file    Inability: Not on file  . Transportation needs:    Medical: Not on file    Non-medical: Not on file  Tobacco Use  . Smoking status: Never Smoker  . Smokeless tobacco: Never Used  Substance and Sexual Activity  . Alcohol use: No  . Drug use: No  .  Sexual activity: Not on file  Lifestyle  . Physical activity:    Days per week: Not on file    Minutes per session: Not on file  . Stress: Not on file  Relationships  . Social connections:    Talks on phone: Not on file    Gets together: Not on file    Attends religious service: Not on file    Active member of club or organization: Not on file    Attends meetings of clubs or organizations: Not on file    Relationship status: Not on file  . Intimate partner violence:    Fear of current or ex partner: Not on file    Emotionally abused: Not on file    Physically abused: Not on file    Forced sexual activity: Not on file    Other Topics Concern  . Not on file  Social History Narrative  . Not on file    Family History  Problem Relation Age of Onset  . Cancer Mother      Review of Systems  Constitutional: Negative.  Negative for chills and fever.  HENT: Negative.  Negative for sore throat.   Eyes: Negative.   Respiratory: Negative.  Negative for shortness of breath.   Cardiovascular: Negative.   Gastrointestinal: Negative.  Negative for abdominal pain, blood in stool, diarrhea, melena, nausea and vomiting.  Genitourinary: Positive for dysuria (improving). Negative for hematuria.  Musculoskeletal: Negative.  Negative for back pain, myalgias and neck pain.  Skin: Negative.  Negative for rash.  Neurological: Negative.  Negative for dizziness and headaches.  Endo/Heme/Allergies: Negative.   All other systems reviewed and are negative.  Vitals:   10/18/17 0932  BP: 102/64  Pulse: 82  Resp: 16  Temp: 98.5 F (36.9 C)  SpO2: 99%     Physical Exam  Constitutional: He is oriented to person, place, and time. He appears well-developed and well-nourished.  HENT:  Head: Normocephalic and atraumatic.  Eyes: Pupils are equal, round, and reactive to light. Conjunctivae and EOM are normal.  Neck: Normal range of motion. Neck supple.  Cardiovascular: Normal rate and regular rhythm.  Pulmonary/Chest: Effort normal and breath sounds normal.  Abdominal: Soft. Bowel sounds are normal. He exhibits no distension. There is no tenderness.  Musculoskeletal: Normal range of motion.  Neurological: He is alert and oriented to person, place, and time. No sensory deficit. He exhibits normal muscle tone.  Skin: Skin is warm and dry. Capillary refill takes less than 2 seconds.  Psychiatric: He has a normal mood and affect. His behavior is normal.  Vitals reviewed.   Results for orders placed or performed in visit on 10/18/17 (from the past 24 hour(s))  POCT urinalysis dipstick     Status: None   Collection Time:  10/18/17 10:05 AM  Result Value Ref Range   Color, UA yellow yellow   Clarity, UA clear clear   Glucose, UA negative negative mg/dL   Bilirubin, UA negative negative   Ketones, POC UA negative negative mg/dL   Spec Grav, UA 1.015 1.010 - 1.025   Blood, UA negative negative   pH, UA 7.5 5.0 - 8.0   Protein Ur, POC negative negative mg/dL   Urobilinogen, UA 0.2 0.2 or 1.0 E.U./dL   Nitrite, UA Negative Negative   Leukocytes, UA Negative Negative    ASSESSMENT & PLAN: Suleiman was seen today for abdominal pain.  Diagnoses and all orders for this visit:  Urinary tract infection due to extended-spectrum beta  lactamase (ESBL) producing Escherichia coli  Lower urinary tract symptoms (LUTS) -     Urine Culture -     POCT urinalysis dipstick    Patient Instructions       IF you received an x-ray today, you will receive an invoice from [facility] Radiology. Please contact [facility] Radiology at [phone] with questions or concerns regarding your invoice.   IF you received labwork today, you will receive an invoice from [facility]. Please contact [facility] at [phone] with questions or concerns regarding your invoice.   Our billing staff will not be able to assist you with questions regarding bills from these companies.  You will be contacted with the lab results as soon as they are available. The fastest way to get your results is to activate your My Chart account. Instructions are located on the last page of this paperwork. If you have not heard from [place] regarding the results in 2 weeks, please contact this office.     Infeccin urinaria en los adultos (Urinary Tract Infection, Adult) Una infeccin urinaria (IU) puede ocurrir en Clinical cytogeneticist de las vas [place]. Las vas urinarias incluyen lo siguiente:  Riones.  Urteres.  Vejiga.  Uretra. Estos rganos fabrican, Buyer, retail y eliminan la orina del organismo. [place] los medicamentos de  venta libre y los recetados solamente como se lo haya indicado el mdico.  Si le recetaron un antibitico, tmelo como se lo haya indicado el mdico. No deje de tomar los antibiticos aunque comience a sentirse mejor.  Evite beber lo siguiente: ? Alcohol. ? Cafena. ? T. ? Bebidas con gas.  Beba suficiente lquido para mantener el pis claro o de color amarillo plido.  Concurra a todas las visitas de control como se lo haya indicado el mdico. Esto es importante.  Asegrese de lo siguiente: ? Vaciar la vejiga con frecuencia y en su totalidad. No contener la orina durante largos perodos. ? Vaciar la vejiga antes y despus de Clinical biochemist. ? Limpiar de adelante hacia atrs despus de defecar, si es mujer. Usar cada trozo de papel una vez cuando se limpie. SOLICITE AYUDA SI:  Siente dolor en la espalda.  Tiene fiebre.  Siente malestar estomacal (nuseas).  Vomita.  Los sntomas no mejoran despus de 3das de tratamiento.  Los sntomas desaparecen y Teacher, adult education. SOLICITE AYUDA DE INMEDIATO SI:  Siente un dolor muy intenso en la espalda.  Siente un dolor muy intenso en la parte inferior del abdomen.  Tiene vmitos y no puede retener los medicamentos ni el agua. Esta informacin no tiene Marine scientist el consejo del mdico. Asegrese de hacerle al mdico cualquier pregunta que tenga. Document Released: 10/04/2009 Document Revised: 08/08/2015 Document Reviewed: 03/07/2015 Elsevier Interactive Patient Education  2018 [employer].  Infeccin por E. Coli (E. Coli Infection) La E. coli (Escherichia coli) es una bacteria que causa una infeccin en varias partes del organismo, incluyendo los intestinos. La bacteria E. coli generalmente vive en los intestinos de las personas y de [place]. La mayora de los tipos de E. coli no causa infecciones, pero algunos producen un veneno (toxina) que puede causar diarrea. Segn la toxina, la diarrea puede ser leve  o grave. Esta afeccin se transmite fcilmente de una persona a la otra (es contagiosa). La E. coli que produce la toxina tambin se puede transmitir de los [place] a los [place]. La mayora de las veces la infeccin por E. coli proviene de las vacas (ganado). En ciertos casos,  esta infeccin puede causar una complicacin peligrosa llamada sndrome urmico hemoltico [facility]). CAUSAS Las causas de una infeccin intestinal por E. coli incluyen las siguientes:  Consumir carne de vaca cruda o poco cocida de ganado infectado.  Tocar un animal infectado y luego tocarse la boca.  Comer frutas o verduras que han estado en contacto con las heces de animales infectados.  Ingerir lquidos que hayan sido contaminados con E. coli de animales infectados.  Estar en contacto con una superficie que ha sido contaminada por una persona infectada. FACTORES DE RIESGO Es ms probable que esta afeccin se manifieste en las personas que:  Consumen carne de vaca cruda o con poca coccin.  Ingieren leche cruda (sin Radio producer), sidra o jugo.  Estn en contacto con vacas, cabras u ovejas. SNTOMAS Los sntomas de esta afeccin generalmente se manifiestan 3 o 4 das despus de que la bacteria fue ingerida. Algunos sntomas son los siguientes:  Clicos intensos y Social research officer, government a la palpacin del abdomen.  Diarrea. Esta puede ser [place] o con [place].  Nuseas y vmitos.  Deshidratacin. La deshidratacin puede causar fatiga, sed, boca seca y orinar con menor frecuencia.  Fiebre baja. Esta no es frecuente. DIAGNSTICO Esta afeccin se diagnostica mediante la historia clnica y un examen fsico. Se puede tomar una muestra de las heces y examinarla para Hydrographic surveyor la presencia de E. coli o toxinas de E. coli. TRATAMIENTO El tratamiento de esta afeccin incluye reposo y la ingesta de lquidos (tratamiento complementario). Si tiene diarrea grave, puede que necesite recibir lquidos por va intravenosa (IV). Los sntomas de  infeccin intestinal por E. coli generalmente desaparecen en el trmino de 5 y 10 das. Algunas cepas de E. coli se pueden tratar con antibiticos o medicamentos antidiarreicos. Sin embargo, este tratamiento es poco frecuente porque estos medicamentos pueden incrementar el riesgo de [place]. INSTRUCCIONES PARA EL CUIDADO EN EL HOGAR Comida y bebida  [place] suficiente lquido para mantener la orina clara o de color amarillo plido. Beba pequeas cantidades de lquidos claros con frecuencia.  Pida instrucciones especficas a su mdico con respecto a la rehidratacin.  No consuma leche, cafena o alcohol.  Haga comidas pequeas y frecuentes Medical sales representative de comidas abundantes. Instrucciones generales  [employer] de venta libre y los recetados solamente como se lo haya indicado el mdico.  Lvese bien las manos antes y despus de cocinar y despus de ir al bao. Asegrese de [employer] personas con las que convive se laven las manos con frecuencia. Use desinfectante para manos si no dispone de [place] y [place].  Limpie todas las superficies que toca con un producto que contenga blanqueador con cloro.  Concurra a todas las visitas de control como se lo haya indicado el mdico. Esto es importante. PREVENCIN  Lave sus manos frecuentemente con agua y [place]. Use desinfectante para manos si no dispone de [place] y [place]. Siempre lvese las manos: ? Despus de ir al bao. ? Antes de estar en contacto con alimentos. ? Despus de preparar o cocinar carne. ? Despus de tocar animales en granjas, zoolgicos o circos.  No coma carne vacuna cruda o poco cocida.  No ingiera leche sin Radio producer o quesos elaborados con [place] cruda. No ingiera sidra de Fish farm manager.  [place] tablas para cortar, las encimeras y los utensilios despus de preparar carne cruda.  [place] y verduras antes de comerlas o cocinarlas. SOLICITE ATENCIN MDICA SI:  Los sntomas no mejoran con  Dispensing optician.  Aparecen nuevos sntomas.  [place] que empeoran. SOLICITE ATENCIN MDICA DE INMEDIATO SI:  Aumenta el dolor, con o sin la palpacin, en el abdomen.  Tiene nuseas o diarrea continuas (persistentes).  Siente dolor abdominal que se Armed forces training and education officer determinado.  La diarrea contiene ms sangre.  Tiene fiebre.  No puede comer ni beber sin vomitar.  Tiene signos de deshidratacin o SUH, por ejemplo: ? Piel plida. ? [place] es [place], muy escasa o no orina. ? Los labios estn agrietados. ? No hay lgrimas cuando llora. ? Sequedad en la boca. ? Ojos hundidos. ? Somnolencia. ? Debilidad. ? Mareos. Esta informacin no tiene Marine scientist el consejo del mdico. Asegrese de hacerle al mdico cualquier pregunta que tenga. Document Released: 08/02/2008 Document Revised: 08/08/2015 Document Reviewed: 10/18/2014 Elsevier Interactive Patient Education  2018 Elsevier Inc.      Agustina Caroli, MD [facility]

## 2017-10-18 NOTE — Patient Instructions (Addendum)
IF you received an x-ray today, you will receive an invoice from [facility] Radiology. Please contact [facility] Radiology at [phone] with questions or concerns regarding your invoice.   IF you received labwork today, you will receive an invoice from [facility]. Please contact [facility] at [phone] with questions or concerns regarding your invoice.   Our billing staff will not be able to assist you with questions regarding bills from these companies.  You will be contacted with the lab results as soon as they are available. The fastest way to get your results is to activate your My Chart account. Instructions are located on the last page of this paperwork. If you have not heard from [place] regarding the results in 2 weeks, please contact this office.     Infeccin urinaria en los adultos (Urinary Tract Infection, Adult) Una infeccin urinaria (IU) puede ocurrir en Clinical cytogeneticist de las vas [place]. Las vas urinarias incluyen lo siguiente:  Riones.  Urteres.  Vejiga.  Uretra. Estos rganos fabrican, Buyer, retail y eliminan la orina del organismo. [place] los medicamentos de venta libre y los recetados solamente como se lo haya indicado el mdico.  Si le recetaron un antibitico, tmelo como se lo haya indicado el mdico. No deje de tomar los antibiticos aunque comience a sentirse mejor.  Evite beber lo siguiente: ? Alcohol. ? Cafena. ? T. ? Bebidas con gas.  Beba suficiente lquido para mantener el pis claro o de color amarillo plido.  Concurra a todas las visitas de control como se lo haya indicado el mdico. Esto es importante.  Asegrese de lo siguiente: ? Vaciar la vejiga con frecuencia y en su totalidad. No contener la orina durante largos perodos. ? Vaciar la vejiga antes y despus de Clinical biochemist. ? Limpiar de adelante hacia atrs despus de defecar, si es mujer. Usar cada trozo de papel una vez cuando se  limpie. SOLICITE AYUDA SI:  Siente dolor en la espalda.  Tiene fiebre.  Siente malestar estomacal (nuseas).  Vomita.  Los sntomas no mejoran despus de 3das de tratamiento.  Los sntomas desaparecen y Teacher, adult education. SOLICITE AYUDA DE INMEDIATO SI:  Siente un dolor muy intenso en la espalda.  Siente un dolor muy intenso en la parte inferior del abdomen.  Tiene vmitos y no puede retener los medicamentos ni el agua. Esta informacin no tiene Marine scientist el consejo del mdico. Asegrese de hacerle al mdico cualquier pregunta que tenga. Document Released: 10/04/2009 Document Revised: 08/08/2015 Document Reviewed: 03/07/2015 Elsevier Interactive Patient Education  2018 [employer].  Infeccin por E. Coli (E. Coli Infection) La E. coli (Escherichia coli) es una bacteria que causa una infeccin en varias partes del organismo, incluyendo los intestinos. La bacteria E. coli generalmente vive en los intestinos de las personas y de [place]. La mayora de los tipos de E. coli no causa infecciones, pero algunos producen un veneno (toxina) que puede causar diarrea. Segn la toxina, la diarrea puede ser leve o grave. Esta afeccin se transmite fcilmente de una persona a la otra (es contagiosa). La E. coli que produce la toxina tambin se puede transmitir de los [place] a los [place]. La mayora de las veces la infeccin por E. coli proviene de las vacas (ganado). En ciertos casos, esta infeccin puede causar una complicacin peligrosa llamada sndrome urmico hemoltico [facility] CAUSAS Las causas de una infeccin intestinal por E. coli incluyen las siguientes:  Consumir carne de vaca cruda o poco cocida de  ganado infectado.  Tocar un animal infectado y luego tocarse la boca.  Comer frutas o verduras que han estado en contacto con las heces de animales infectados.  Ingerir lquidos que hayan sido contaminados con E. coli de animales infectados.  Estar en contacto con  una superficie que ha sido contaminada por una persona infectada. FACTORES DE RIESGO Es ms probable que esta afeccin se manifieste en las personas que:  Consumen carne de vaca cruda o con poca coccin.  Ingieren leche cruda (sin Radio producer), sidra o jugo.  Estn en contacto con vacas, cabras u ovejas. SNTOMAS Los sntomas de esta afeccin generalmente se manifiestan 3 o 4 das despus de que la bacteria fue ingerida. Algunos sntomas son los siguientes:  Clicos intensos y Social research officer, government a la palpacin del abdomen.  Diarrea. Esta puede ser [place] o con [place].  Nuseas y vmitos.  Deshidratacin. La deshidratacin puede causar fatiga, sed, boca seca y orinar con menor frecuencia.  Fiebre baja. Esta no es frecuente. DIAGNSTICO Esta afeccin se diagnostica mediante la historia clnica y un examen fsico. Se puede tomar una muestra de las heces y examinarla para Hydrographic surveyor la presencia de E. coli o toxinas de E. coli. TRATAMIENTO El tratamiento de esta afeccin incluye reposo y la ingesta de lquidos (tratamiento complementario). Si tiene diarrea grave, puede que necesite recibir lquidos por va intravenosa (IV). Los sntomas de infeccin intestinal por E. coli generalmente desaparecen en el trmino de 5 y 10 das. Algunas cepas de E. coli se pueden tratar con antibiticos o medicamentos antidiarreicos. Sin embargo, este tratamiento es poco frecuente porque estos medicamentos pueden incrementar el riesgo de [place]. INSTRUCCIONES PARA EL CUIDADO EN EL HOGAR Comida y bebida  [place] suficiente lquido para mantener la orina clara o de color amarillo plido. Beba pequeas cantidades de lquidos claros con frecuencia.  Pida instrucciones especficas a su mdico con respecto a la rehidratacin.  No consuma leche, cafena o alcohol.  Haga comidas pequeas y frecuentes Medical sales representative de comidas abundantes. Instrucciones generales  [employer] de venta libre y los recetados  solamente como se lo haya indicado el mdico.  Lvese bien las manos antes y despus de cocinar y despus de ir al bao. Asegrese de [employer] personas con las que convive se laven las manos con frecuencia. Use desinfectante para manos si no dispone de [place] y [place].  Limpie todas las superficies que toca con un producto que contenga blanqueador con cloro.  Concurra a todas las visitas de control como se lo haya indicado el mdico. Esto es importante. PREVENCIN  Lave sus manos frecuentemente con agua y [place]. Use desinfectante para manos si no dispone de [place] y [place]. Siempre lvese las manos: ? Despus de ir al bao. ? Antes de estar en contacto con alimentos. ? Despus de preparar o cocinar carne. ? Despus de tocar animales en granjas, zoolgicos o circos.  No coma carne vacuna cruda o poco cocida.  No ingiera leche sin Radio producer o quesos elaborados con [place] cruda. No ingiera sidra de Fish farm manager.  [place] tablas para cortar, las encimeras y los utensilios despus de preparar carne cruda.  [place] y verduras antes de comerlas o cocinarlas. SOLICITE ATENCIN MDICA SI:  Los sntomas no mejoran con Dispensing optician.  Aparecen nuevos sntomas.  [place] que empeoran. SOLICITE ATENCIN MDICA DE INMEDIATO SI:  Aumenta el dolor, con o sin la palpacin, en el abdomen.  Tiene nuseas o diarrea continuas (  persistentes).  Siente dolor abdominal que se Armed forces training and education officer determinado.  La diarrea contiene ms sangre.  Tiene fiebre.  No puede comer ni beber sin vomitar.  Tiene signos de deshidratacin o SUH, por ejemplo: ? Piel plida. ? [place] es [place], muy escasa o no orina. ? Los labios estn agrietados. ? No hay lgrimas cuando llora. ? Sequedad en la boca. ? Ojos hundidos. ? Somnolencia. ? Debilidad. ? Mareos. Esta informacin no tiene Marine scientist el consejo del mdico. Asegrese de hacerle al mdico cualquier  pregunta que tenga. Document Released: 08/02/2008 Document Revised: 08/08/2015 Document Reviewed: 10/18/2014 Elsevier Interactive Patient Education  2018 [employer].

## 2017-10-19 LAB — URINE CULTURE: Organism ID, Bacteria: NO GROWTH

## 2017-10-23 ENCOUNTER — Ambulatory Visit: Payer: 59 | Admitting: Emergency Medicine

## 2017-10-23 ENCOUNTER — Encounter: Payer: Self-pay | Admitting: Emergency Medicine

## 2017-10-23 ENCOUNTER — Other Ambulatory Visit: Payer: Self-pay

## 2017-10-23 VITALS — BP 100/64 | HR 91 | Temp 98.5°F | Resp 16 | Wt 157.0 lb

## 2017-10-23 DIAGNOSIS — Z1612 Extended spectrum beta lactamase (ESBL) resistance: Secondary | ICD-10-CM

## 2017-10-23 DIAGNOSIS — B9629 Other Escherichia coli [E. coli] as the cause of diseases classified elsewhere: Secondary | ICD-10-CM | POA: Diagnosis not present

## 2017-10-23 DIAGNOSIS — N390 Urinary tract infection, site not specified: Secondary | ICD-10-CM

## 2017-10-23 DIAGNOSIS — R399 Unspecified symptoms and signs involving the genitourinary system: Secondary | ICD-10-CM | POA: Diagnosis not present

## 2017-10-23 LAB — POC MICROSCOPIC URINALYSIS (UMFC): Mucus: ABSENT

## 2017-10-23 MED ORDER — NITROFURANTOIN MONOHYD MACRO 100 MG PO CAPS: 100.0000 mg | ORAL_CAPSULE | Freq: Two times a day (BID) | ORAL | 0 refills | Status: AC

## 2017-10-23 NOTE — Patient Instructions (Addendum)
     IF you received an x-ray today, you will receive an invoice from [facility]. Please contact [facility] at [phone] with questions or concerns regarding your invoice.   IF you received labwork today, you will receive an invoice from [place]. Please contact [facility] at [phone] with questions or concerns regarding your invoice.   Our billing staff will not be able to assist you with questions regarding bills from these companies.  You will be contacted with the lab results as soon as they are available. The fastest way to get your results is to activate your My Chart account. Instructions are located on the last page of this paperwork. If you have not heard from [place] regarding the results in 2 weeks, please contact this office.     Infeccin urinaria en los adultos (Urinary Tract Infection, Adult) Una infeccin urinaria (IU) puede ocurrir en Clinical cytogeneticist de las vas [place]. Las vas urinarias incluyen lo siguiente:  Riones.  Urteres.  Vejiga.  Uretra. Estos rganos fabrican, Buyer, retail y eliminan la orina del organismo. [place] los medicamentos de venta libre y los recetados solamente como se lo haya indicado el mdico.  Si le recetaron un antibitico, tmelo como se lo haya indicado el mdico. No deje de tomar los antibiticos aunque comience a sentirse mejor.  Evite beber lo siguiente: ? Alcohol. ? Cafena. ? T. ? Bebidas con gas.  Beba suficiente lquido para mantener el pis claro o de color amarillo plido.  Concurra a todas las visitas de control como se lo haya indicado el mdico. Esto es importante.  Asegrese de lo siguiente: ? Vaciar la vejiga con frecuencia y en su totalidad. No contener la orina durante largos perodos. ? Vaciar la vejiga antes y despus de Clinical biochemist. ? Limpiar de adelante hacia atrs despus de defecar, si es mujer. Usar cada trozo de papel una vez cuando se  limpie. SOLICITE AYUDA SI:  Siente dolor en la espalda.  Tiene fiebre.  Siente malestar estomacal (nuseas).  Vomita.  Los sntomas no mejoran despus de 3das de tratamiento.  Los sntomas desaparecen y Teacher, adult education. SOLICITE AYUDA DE INMEDIATO SI:  Siente un dolor muy intenso en la espalda.  Siente un dolor muy intenso en la parte inferior del abdomen.  Tiene vmitos y no puede retener los medicamentos ni el agua. Esta informacin no tiene Marine scientist el consejo del mdico. Asegrese de hacerle al mdico cualquier pregunta que tenga. Document Released: 10/04/2009 Document Revised: 08/08/2015 Document Reviewed: 03/07/2015 Elsevier Interactive Patient Education  [employer].

## 2017-10-23 NOTE — Progress Notes (Signed)
Jacob Dixon 57 y.o.   Chief Complaint  Patient presents with  . Urinary Tract Infection    FOLLOW UP - per patient a little better    HISTORY OF PRESENT ILLNESS: This is a 57 y.o. male here for follow-up of E. coli UTI.  On Macrobid twice a day, last day today, doing better.  No longer burning on urination.  Still has some residual post voiding burning sensation.  No diarrhea, nausea, vomiting, fever or chills.  Overall doing much better.  HPI   Prior to Admission medications   Medication Sig Start Date End Date Taking? Authorizing Provider  nitrofurantoin, macrocrystal-monohydrate, (MACROBID) 100 MG capsule Take 1 capsule (100 mg total) by mouth 2 (two) times daily for 7 days. 10/16/17 10/23/17 Yes Dorthie Santini, Ines Bloomer, MD  phenazopyridine (PYRIDIUM) 200 MG tablet Take 1 tablet (200 mg total) by mouth 3 (three) times daily as needed for pain. 10/08/17  Yes SagardiaInes Bloomer, MD    No Known Allergies  Patient Active Problem List   Diagnosis Date Noted  . Urinary tract infection due to extended-spectrum beta lactamase (ESBL) producing Escherichia coli 10/18/2017  . Lower urinary tract symptoms (LUTS) 10/18/2017  . Dysuria 10/08/2017  . Lower abdominal pain 10/08/2017  . Rectal pain 10/08/2017    Past Medical History:  Diagnosis Date  . Allergy   . Anemia   . Arthritis   . Neuromuscular disorder ([place])     No past surgical history on file.  Social History   Socioeconomic History  . Marital status: Married    Spouse name: Not on file  . Number of children: Not on file  . Years of education: Not on file  . Highest education level: Not on file  Occupational History  . Not on file  Social Needs  . Financial resource strain: Not on file  . Food insecurity:    Worry: Not on file    Inability: Not on file  . Transportation needs:    Medical: Not on file    Non-medical: Not on file  Tobacco Use  . Smoking status: Never Smoker  . Smokeless tobacco: Never  Used  Substance and Sexual Activity  . Alcohol use: No  . Drug use: No  . Sexual activity: Not on file  Lifestyle  . Physical activity:    Days per week: Not on file    Minutes per session: Not on file  . Stress: Not on file  Relationships  . Social connections:    Talks on phone: Not on file    Gets together: Not on file    Attends religious service: Not on file    Active member of club or organization: Not on file    Attends meetings of clubs or organizations: Not on file    Relationship status: Not on file  . Intimate partner violence:    Fear of current or ex partner: Not on file    Emotionally abused: Not on file    Physically abused: Not on file    Forced sexual activity: Not on file  Other Topics Concern  . Not on file  Social History Narrative  . Not on file    Family History  Problem Relation Age of Onset  . Cancer Mother      Review of Systems  Constitutional: Negative.  Negative for chills and fever.  Gastrointestinal: Negative.  Negative for abdominal pain, blood in stool, diarrhea, nausea and vomiting.  Genitourinary: Negative.  Negative for dysuria and  hematuria.  Skin: Negative.  Negative for rash.  Neurological: Negative.  Negative for dizziness and headaches.  All other systems reviewed and are negative.   Vitals:   10/23/17 1629  BP: 100/64  Pulse: 91  Resp: 16  Temp: 98.5 F (36.9 C)  SpO2: 97%    Physical Exam  Constitutional: He appears well-developed and well-nourished.  HENT:  Head: Normocephalic and atraumatic.  Eyes: Pupils are equal, round, and reactive to light.  Neck: Normal range of motion.  Cardiovascular: Normal rate and regular rhythm.  Pulmonary/Chest: Effort normal.  Abdominal: Soft. Bowel sounds are normal. He exhibits no distension. There is no tenderness. Hernia confirmed negative in the right inguinal area and confirmed negative in the left inguinal area.  Genitourinary: Testes normal. Right testis shows no  tenderness. Left testis shows no tenderness. No penile tenderness.  Lymphadenopathy: No inguinal adenopathy noted on the right or left side.  Vitals reviewed.    Results for orders placed or performed in visit on 10/23/17 (from the past 24 hour(s))  POCT Microscopic Urinalysis ([facility])     Status: Abnormal   Collection Time: 10/23/17  5:11 PM  Result Value Ref Range   WBC,UR,HPF,POC None None WBC/hpf   RBC,UR,HPF,POC None None RBC/hpf   Bacteria Few (A) None, Too numerous to count   Mucus Absent Absent   Epithelial Cells, UR Per Microscopy None None, Too numerous to count cells/hpf     ASSESSMENT & PLAN: Fadil was seen today for urinary tract infection.  Diagnoses and all orders for this visit:  Urinary tract infection due to extended-spectrum beta lactamase (ESBL) producing Escherichia coli -     nitrofurantoin, macrocrystal-monohydrate, (MACROBID) 100 MG capsule; Take 1 capsule (100 mg total) by mouth 2 (two) times daily for 5 days. -     Urine Culture -     POCT Microscopic Urinalysis ([facility])  Lower urinary tract symptoms (LUTS) Comments: improved    Patient Instructions       IF you received an x-ray today, you will receive an invoice from [facility]. Please contact [facility] at [phone] with questions or concerns regarding your invoice.   IF you received labwork today, you will receive an invoice from [place]. Please contact [facility] at [phone] with questions or concerns regarding your invoice.   Our billing staff will not be able to assist you with questions regarding bills from these companies.  You will be contacted with the lab results as soon as they are available. The fastest way to get your results is to activate your My Chart account. Instructions are located on the last page of this paperwork. If you have not heard from [place] regarding the results in 2 weeks, please contact this office.     Infeccin urinaria en los  adultos (Urinary Tract Infection, Adult) Una infeccin urinaria (IU) puede ocurrir en Clinical cytogeneticist de las vas [place]. Las vas urinarias incluyen lo siguiente:  Riones.  Urteres.  Vejiga.  Uretra. Estos rganos fabrican, Buyer, retail y eliminan la orina del organismo. [place] los medicamentos de venta libre y los recetados solamente como se lo haya indicado el mdico.  Si le recetaron un antibitico, tmelo como se lo haya indicado el mdico. No deje de tomar los antibiticos aunque comience a sentirse mejor.  Evite beber lo siguiente: ? Alcohol. ? Cafena. ? T. ? Bebidas con gas.  Beba suficiente lquido para mantener el pis claro o de color amarillo plido.  Concurra a todas las  visitas de control como se lo haya indicado el mdico. Esto es importante.  Asegrese de lo siguiente: ? Vaciar la vejiga con frecuencia y en su totalidad. No contener la orina durante largos perodos. ? Vaciar la vejiga antes y despus de Clinical biochemist. ? Limpiar de adelante hacia atrs despus de defecar, si es mujer. Usar cada trozo de papel una vez cuando se limpie. SOLICITE AYUDA SI:  Siente dolor en la espalda.  Tiene fiebre.  Siente malestar estomacal (nuseas).  Vomita.  Los sntomas no mejoran despus de 3das de tratamiento.  Los sntomas desaparecen y Teacher, adult education. SOLICITE AYUDA DE INMEDIATO SI:  Siente un dolor muy intenso en la espalda.  Siente un dolor muy intenso en la parte inferior del abdomen.  Tiene vmitos y no puede retener los medicamentos ni el agua. Esta informacin no tiene Marine scientist el consejo del mdico. Asegrese de hacerle al mdico cualquier pregunta que tenga. Document Released: 10/04/2009 Document Revised: 08/08/2015 Document Reviewed: 03/07/2015 Elsevier Interactive Patient Education  2018 [employer] Inc.      Agustina Caroli, MD [facility]

## 2017-10-24 LAB — URINE CULTURE

## 2017-10-26 ENCOUNTER — Encounter: Payer: Self-pay | Admitting: *Deleted

## 2017-10-26 NOTE — Progress Notes (Signed)
Letter sent.

## 2017-11-18 ENCOUNTER — Encounter: Payer: Self-pay | Admitting: Emergency Medicine

## 2018-01-24 DIAGNOSIS — M25559 Pain in unspecified hip: Secondary | ICD-10-CM | POA: Diagnosis not present

## 2018-01-24 DIAGNOSIS — G5701 Lesion of sciatic nerve, right lower limb: Secondary | ICD-10-CM | POA: Diagnosis not present

## 2018-01-24 DIAGNOSIS — M545 Low back pain: Secondary | ICD-10-CM | POA: Diagnosis not present

## 2018-02-03 ENCOUNTER — Other Ambulatory Visit: Payer: Self-pay

## 2018-02-03 ENCOUNTER — Encounter: Payer: Self-pay | Admitting: Family Medicine

## 2018-02-03 ENCOUNTER — Ambulatory Visit (INDEPENDENT_AMBULATORY_CARE_PROVIDER_SITE_OTHER): Payer: 59 | Admitting: Family Medicine

## 2018-02-03 VITALS — BP 122/81 | HR 79 | Temp 98.7°F | Ht 60.0 in | Wt 158.2 lb

## 2018-02-03 DIAGNOSIS — M25552 Pain in left hip: Secondary | ICD-10-CM

## 2018-02-03 DIAGNOSIS — Z87440 Personal history of urinary (tract) infections: Secondary | ICD-10-CM

## 2018-02-03 DIAGNOSIS — M545 Low back pain, unspecified: Secondary | ICD-10-CM

## 2018-02-03 DIAGNOSIS — R509 Fever, unspecified: Secondary | ICD-10-CM | POA: Diagnosis not present

## 2018-02-03 DIAGNOSIS — M25551 Pain in right hip: Secondary | ICD-10-CM | POA: Diagnosis not present

## 2018-02-03 DIAGNOSIS — R319 Hematuria, unspecified: Secondary | ICD-10-CM

## 2018-02-03 DIAGNOSIS — Z789 Other specified health status: Secondary | ICD-10-CM

## 2018-02-03 LAB — POCT CBC
Granulocyte percent: 70 %G (ref 37–80)
HEMATOCRIT: 46 % (ref 43.5–53.7)
Hemoglobin: 15.3 g/dL (ref 14.1–18.1)
LYMPH, POC: 1.9 (ref 0.6–3.4)
MCH, POC: 31.3 pg — AB (ref 27–31.2)
MCHC: 33.3 g/dL (ref 31.8–35.4)
MCV: 94 fL (ref 80–97)
MID (cbc): 0.6 (ref 0–0.9)
MPV: 6 fL (ref 0–99.8)
POC GRANULOCYTE: 5.8 (ref 2–6.9)
POC LYMPH %: 22.8 % (ref 10–50)
POC MID %: 7.2 %M (ref 0–12)
Platelet Count, POC: 407 10*3/uL (ref 142–424)
RBC: 4.9 M/uL (ref 4.69–6.13)
RDW, POC: 11.3 %
WBC: 8.3 10*3/uL (ref 4.6–10.2)

## 2018-02-03 LAB — POCT URINALYSIS DIP (MANUAL ENTRY)
Bilirubin, UA: NEGATIVE
Glucose, UA: NEGATIVE mg/dL
Ketones, POC UA: NEGATIVE mg/dL
LEUKOCYTES UA: NEGATIVE
NITRITE UA: NEGATIVE
PH UA: 7 (ref 5.0–8.0)
Protein Ur, POC: NEGATIVE mg/dL
Spec Grav, UA: 1.01 (ref 1.010–1.025)
Urobilinogen, UA: 0.2 E.U./dL

## 2018-02-03 MED ORDER — TRAMADOL HCL 50 MG PO TABS: 50.0000 mg | ORAL_TABLET | Freq: Four times a day (QID) | ORAL | 0 refills | Status: DC | PRN

## 2018-02-03 NOTE — Progress Notes (Signed)
Jacob Dixon  Jacob Dixon

## 2018-02-03 NOTE — Progress Notes (Addendum)
Subjective:  By signing my name below, I, Moises Blood, attest that this documentation has been prepared under the direction and in the presence of [name], MD. Electronically Signed: Moises Blood, [facility]. 02/03/2018 , 5:17 PM .  Patient was seen in Room 12 .   Patient ID: Jacob Dixon, male    DOB: 11/11/1960, 57 y.o.   MRN: [id number] Chief Complaint  Patient presents with  . Abdominal Pain    having pain that is rasiating from the lower stomach to the back for the past 2wks   HPI Jacob Dixon is a 57 y.o. male  Here for bilateral hip and low back pain that started about 2 weeks ago. Patient states pain started in bilateral hips, and then radiated to his low back bilaterally. He was initially seen for this at a nearby practice on Sept 27th, at [facility]. He was treated with Celebrex 200 mg bid which gives him slight temporary relief; although, he notes his symptoms have worsened now due to pain in his legs and in his back. He was treated for UTI in June 2019. He had elevated uric acid at 8.4 with pain in his right knee on Aug 27th, 2017.   He mentions having similar pains in the past approximately 4-5 years with pain started on the right hip, and then radiated to the left side; at that time, rated pain 6-8/10. He's unsure what the cause of the pain was. He mentions 1 episode of night sweats last week. He felt feverish subjectively last night with chills. He denies unexplained weight loss, loss of appetite, blood in stool, urinary/bowel symptoms, hematuria, or dysuria. He denies any falls. He denies any sick contact at home. He denies any history of seizures. He denies any known drug allergies.   Patient's primary language is [place]. [employer] video interpreter, [phone] [place].    Patient Active Problem List   Diagnosis Date Noted  . Urinary tract infection due to extended-spectrum beta lactamase (ESBL) producing Escherichia coli 10/18/2017  .  Lower urinary tract symptoms (LUTS) 10/18/2017  . Dysuria 10/08/2017  . Lower abdominal pain 10/08/2017  . Rectal pain 10/08/2017   Past Medical History:  Diagnosis Date  . Allergy   . Anemia   . Arthritis   . Neuromuscular disorder ([place])    History reviewed. No pertinent surgical history. No Known Allergies Prior to Admission medications   Medication Sig Start Date End Date Taking? Authorizing Provider  celecoxib (CELEBREX) 200 MG capsule TAKE 1 CAPSULE BY MOUTH TWICE DAILY AS NEEDED FOR PAIN WITH FOOD 01/24/18   [provider]  phenazopyridine (PYRIDIUM) 200 MG tablet Take 1 tablet (200 mg total) by mouth 3 (three) times daily as needed for pain. 10/08/17   Horald Pollen, MD   Social History   Socioeconomic History  . Marital status: Married    Spouse name: Not on file  . Number of children: Not on file  . Years of education: Not on file  . Highest education level: Not on file  Occupational History  . Not on file  Social Needs  . Financial resource strain: Not on file  . Food insecurity:    Worry: Not on file    Inability: Not on file  . Transportation needs:    Medical: Not on file    Non-medical: Not on file  Tobacco Use  . Smoking status: Never Smoker  . Smokeless tobacco: Never Used  Substance and Sexual Activity  . Alcohol use: No  .  Drug use: No  . Sexual activity: Not on file  Lifestyle  . Physical activity:    Days per week: Not on file    Minutes per session: Not on file  . Stress: Not on file  Relationships  . Social connections:    Talks on phone: Not on file    Gets together: Not on file    Attends religious service: Not on file    Active member of club or organization: Not on file    Attends meetings of clubs or organizations: Not on file    Relationship status: Not on file  . Intimate partner violence:    Fear of current or ex partner: Not on file    Emotionally abused: Not on file    Physically abused: Not on file    Forced  sexual activity: Not on file  Other Topics Concern  . Not on file  Social History Narrative  . Not on file   Review of Systems  Constitutional: Positive for chills, fatigue and fever. Negative for activity change, appetite change and unexpected weight change.  Eyes: Negative for visual disturbance.  Respiratory: Negative for cough, chest tightness and shortness of breath.   Cardiovascular: Negative for chest pain, palpitations and leg swelling.  Gastrointestinal: Negative for abdominal pain, anal bleeding and blood in stool.  Genitourinary: Negative for dysuria, frequency, hematuria and urgency.  Musculoskeletal: Positive for back pain and myalgias.  Neurological: Negative for dizziness, weakness, light-headedness and headaches.       Objective:   Physical Exam  Constitutional: He is oriented to person, place, and time. He appears well-developed and well-nourished. No distress.  HENT:  Head: Normocephalic and atraumatic.  Eyes: Pupils are equal, round, and reactive to light. EOM are normal.  Neck: Neck supple.  Cardiovascular: Normal rate.  Pulmonary/Chest: Effort normal. No respiratory distress.  Musculoskeletal: Normal range of motion.  He is able to walk without assistance but does reproduce some pain in his hips; forward flexion to 90 degrees but has pain with it; tender along paraspinals lower lumbar spine more so than midline, but more towards SI joints, negative seated straight leg raise, no focal pain with internal/external rotation of left and right hips  Neurological: He is alert and oriented to person, place, and time.  Skin: Skin is warm and dry.  Psychiatric: He has a normal mood and affect. His behavior is normal.  Nursing note and vitals reviewed.   Vitals:   02/03/18 1608  BP: 122/81  Pulse: 79  Temp: 98.7 F (37.1 C)  TempSrc: Oral  SpO2: 97%  Weight: 158 lb 3.2 oz (71.8 kg)  Height: 5' (1.524 m)   Results for orders placed or performed in visit on  02/03/18  Sedimentation Rate  Result Value Ref Range   Sed Rate 28 0 - 30 mm/hr  POCT urinalysis dipstick  Result Value Ref Range   Color, UA yellow yellow   Clarity, UA clear clear   Glucose, UA negative negative mg/dL   Bilirubin, UA negative negative   Ketones, POC UA negative negative mg/dL   Spec Grav, UA 1.010 1.010 - 1.025   Blood, UA trace-lysed (A) negative   pH, UA 7.0 5.0 - 8.0   Protein Ur, POC negative negative mg/dL   Urobilinogen, UA 0.2 0.2 or 1.0 E.U./dL   Nitrite, UA Negative Negative   Leukocytes, UA Negative Negative  POCT CBC  Result Value Ref Range   WBC 8.3 4.6 - 10.2 K/uL   Lymph,  poc 1.9 0.6 - 3.4   POC LYMPH PERCENT 22.8 10 - 50 %L   MID (cbc) 0.6 0 - 0.9   POC MID % 7.2 0 - 12 %M   POC Granulocyte 5.8 2 - 6.9   Granulocyte percent 70.0 37 - 80 %G   RBC 4.90 4.69 - 6.13 M/uL   Hemoglobin 15.3 14.1 - 18.1 g/dL   HCT, POC 46.0 43.5 - 53.7 %   MCV 94.0 80 - 97 fL   MCH, POC 31.3 (A) 27 - 31.2 pg   MCHC 33.3 31.8 - 35.4 g/dL   RDW, POC 11.3 %   Platelet Count, POC 407 142 - 424 K/uL   MPV 6.0 0 - 99.8 fL       Assessment & Plan:    Jacob Dixon is a 57 y.o. male Acute bilateral low back pain, unspecified whether sciatica present - Plan: POCT urinalysis dipstick, Sedimentation Rate, POCT CBC, traMADol (ULTRAM) 50 MG tablet  Bilateral hip pain - Plan: Sedimentation Rate, POCT CBC, traMADol (ULTRAM) 50 MG tablet  Fever and chills - Plan: Sedimentation Rate, POCT CBC, Urine Culture  Hematuria, unspecified type - Plan: Urine Culture  History of urinary infection - Plan: Urine Culture  Language barrier  History of degenerative disc space narrowing at L3-4 and facet joint hypertrophy L4-5, L5-S1 on imaging in 2016.  Bilateral low back pain with bilateral hip pain possible radicular pain from lumbar spine versus inflammatory arthritis given reported fever and chills.  However his sed rate and CBC were reassuring.  Denies urinary symptoms,  but hematuria noted.  Less likely nephrolith with bilateral symptoms and location of symptoms.  Less likely infection without symptoms of infection at this time.  -Check urine culture  -Continue Celebrex same dose for now, tramadol provided for acute pain relief if needed  -Out of work, letter can be provided if needed  -Recheck in 48 to 72 hours, sooner if any worsening or ER precautions given.  -Spanish spoken and video translator used, understanding expressed  Meds ordered this encounter  Medications  . traMADol (ULTRAM) 50 MG tablet    Sig: Take 1 tablet (50 mg total) by mouth every 6 (six) hours as needed. Label in [place].    Dispense:  20 tablet    Refill:  0   Patient Instructions    Continua celebrex, y si es necesario tramadol por dolor.   regrese en 2 dias.  Va a hospital si empeoran antes de [employer].    Dolor de espalda en adultos Back Pain, Adult Muchos adultos sufren dolor de espalda de vez en cuando. Las causas comunes del dolor de espalda son las siguientes:  Ardelia Mems distensin del msculo o el ligamento.  Desgaste (degeneracin) de los discos vertebrales.  Artritis.  Un golpe en la espalda.  El dolor de espalda puede ser breve (agudo) o prolongado (crnico). Es posible que se realicen un examen fsico, anlisis de laboratorio u otros estudios de diagnstico por imgenes para Animator causa del Social research officer, government. Siga estas indicaciones en su casa: Control del dolor y de la rigidez  [employer] medicamentos de venta libre y los recetados solamente como se lo haya indicado el mdico.  Si se lo indican, aplique calor en la zona afectada con la frecuencia que le haya indicado el mdico. Use la fuente de calor que el mdico le recomiende, como una compresa de calor hmedo o una almohadilla trmica. ? Colquese una [employer] piel y la fuente de  calor. ? Aplique el calor durante 20 a 41minutos. ? Retire la fuente de calor si la piel se le pone de color rojo brillante.  Esto es muy importante si no puede sentir el dolor, el calor ni el fro. Corre un mayor riesgo de sufrir quemaduras.  Si se lo indican, aplique hielo sobre la zona lesionada: ? Ponga el hielo en una bolsa plstica. ? Coloque una [employer] piel y la bolsa de hielo. ? Deje el hielo durante 100minutos, de 2 a 3veces por da, durante los primeros 2 o 3das. Actividad  No permanezca en la cama. Si hace reposo ms de 1 a 2 das, puede demorar su recuperacin.  Realice caminatas cortas en superficies planas tan pronto como le sea posible. Trate de caminar un poco ms de Publishing copy.  No se siente, conduzca o permanezca de pie en un mismo lugar durante ms de 30 minutos seguidos. Pararse o sentarse durante largos perodos de Radiographer, therapeutic la espalda.  Use tcnicas apropiadas para levantar objetos. Cuando se inclina y Chief Executive Officer un [place], utilice posiciones que no sobrecarguen tanto la espalda: ? [place]. ? Mantenga la carga cerca del cuerpo. ? No se tuerza.  Haga actividad fsica habitualmente como se lo haya indicado el mdico. Hacer ejercicio ayudar a que su espalda se cure ms rpido. Adems, esto ayuda a prevenir lesiones de espalda al [employer] fuertes y flexibles.  El mdico puede recomendarle que consulte a un fisioterapeuta. Esta persona puede ayudarlo a elaborar un programa de ejercicios seguro. Haga ejercicios como se lo haya indicado el fisioterapeuta. Estilo de vida  Mantenga un peso saludable. El sobrepeso sobrecarga la espalda y hace que resulte difcil tener una buena [place].  Evite actividades o situaciones que lo hagan sentirse ansioso o estresado. Aprenda maneras de manejar la ansiedad y el estrs. Una forma de controlar el estrs es a travs del ejercicio. El estrs y la ansiedad aumentan la tensin muscular y pueden empeorar el dolor de espalda. Instrucciones generales  Duerma sobre un colchn firme en una posicin cmoda.  Intente acostarse de costado, con las rodillas ligeramente flexionadas. Si se recuesta [employer], coloque una almohada debajo de las rodillas.  Siga el plan de tratamiento como se lo haya indicado el mdico. Esto puede incluir lo siguiente: ? Terapia cognitiva o conductual. ? Acupuntura o terapia de masajes. ? Yoga o meditacin. Comunquese con un mdico si:  Siente un dolor que no se alivia con reposo o medicamentos.  Siente mucho dolor que se extiende a las piernas o las nalgas.  El dolor no mejora en 2 semanas.  Siente dolor por la noche.  Pierde peso.  Tiene fiebre o siente escalofros. Solicite ayuda de inmediato si:  Tiene nuevos problemas para controlar la vejiga o los intestinos.  Siente debilidad o adormecimiento inusuales en los brazos o en las piernas.  Siente nuseas o vmitos.  Siente dolor abdominal.  Siente que va a desmayarse. Resumen  Muchos adultos sufren dolor de espalda de vez en cuando. Es posible que se realicen un examen fsico, anlisis de laboratorio u otros estudios de diagnstico por imgenes para Animator causa del Social research officer, government.  Use tcnicas apropiadas para levantar objetos. Cuando se inclina y levanta un [place], utilice posiciones que no sobrecarguen tanto la espalda.  Tome los medicamentos de venta libre o recetados y Education officer, environmental o hielo solamente como se lo haya indicado el mdico. Esta informacin no tiene Psychologist, clinical consejo  del mdico. Asegrese de hacerle al mdico cualquier pregunta que tenga. Document Released: 04/16/2005 Document Revised: 08/06/2016 Document Reviewed: 08/06/2016 Elsevier Interactive Patient Education  [employer].        If you have lab work done today you will be contacted with your lab results within the next 2 weeks.  If you have not heard from [place] then please contact us. The fastest way to get your results is to register for My Chart.   IF you received an x-ray today, you will receive  an invoice from [facility]. Please contact [facility] at [phone] with questions or concerns regarding your invoice.   IF you received labwork today, you will receive an invoice from [place]. Please contact [facility] at [phone] with questions or concerns regarding your invoice.   Our billing staff will not be able to assist you with questions regarding bills from these companies.  You will be contacted with the lab results as soon as they are available. The fastest way to get your results is to activate your My Chart account. Instructions are located on the last page of this paperwork. If you have not heard from [place] regarding the results in 2 weeks, please contact this office.      I personally performed the services described in this documentation, which was scribed in my presence. The recorded information has been reviewed and considered for accuracy and completeness, addended by me as needed, and agree with information above.  Signed,   [name], MD [facility] [place].  02/04/18 11:58 AM

## 2018-02-03 NOTE — Patient Instructions (Addendum)
Continua celebrex, y si es necesario tramadol por dolor.   regrese en 2 dias.  Va a hospital si empeoran antes de [employer].    Dolor de espalda en adultos Back Pain, Adult Muchos adultos sufren dolor de espalda de vez en cuando. Las causas comunes del dolor de espalda son las siguientes:  Ardelia Mems distensin del msculo o el ligamento.  Desgaste (degeneracin) de los discos vertebrales.  Artritis.  Un golpe en la espalda.  El dolor de espalda puede ser breve (agudo) o prolongado (crnico). Es posible que se realicen un examen fsico, anlisis de laboratorio u otros estudios de diagnstico por imgenes para Animator causa del Social research officer, government. Siga estas indicaciones en su casa: Control del dolor y de la rigidez  [employer] medicamentos de venta libre y los recetados solamente como se lo haya indicado el mdico.  Si se lo indican, aplique calor en la zona afectada con la frecuencia que le haya indicado el mdico. Use la fuente de calor que el mdico le recomiende, como una compresa de calor hmedo o una almohadilla trmica. ? Colquese una [employer] piel y la fuente de Freight forwarder. ? Aplique el calor durante 20 a 46minutos. ? Retire la fuente de calor si la piel se le pone de color rojo brillante. Esto es muy importante si no puede sentir el dolor, el calor ni el fro. Corre un mayor riesgo de sufrir quemaduras.  Si se lo indican, aplique hielo sobre la zona lesionada: ? Ponga el hielo en una bolsa plstica. ? Coloque una [employer] piel y la bolsa de hielo. ? Deje el hielo durante 47minutos, de 2 a 3veces por da, durante los primeros 2 o 3das. Actividad  No permanezca en la cama. Si hace reposo ms de 1 a 2 das, puede demorar su recuperacin.  Realice caminatas cortas en superficies planas tan pronto como le sea posible. Trate de caminar un poco ms de Publishing copy.  No se siente, conduzca o permanezca de pie en un mismo lugar durante ms de 30 minutos seguidos. Pararse o  sentarse durante largos perodos de Radiographer, therapeutic la espalda.  Use tcnicas apropiadas para levantar objetos. Cuando se inclina y Chief Executive Officer un [place], utilice posiciones que no sobrecarguen tanto la espalda: ? [place]. ? Mantenga la carga cerca del cuerpo. ? No se tuerza.  Haga actividad fsica habitualmente como se lo haya indicado el mdico. Hacer ejercicio ayudar a que su espalda se cure ms rpido. Adems, esto ayuda a prevenir lesiones de espalda al [employer] fuertes y flexibles.  El mdico puede recomendarle que consulte a un fisioterapeuta. Esta persona puede ayudarlo a elaborar un programa de ejercicios seguro. Haga ejercicios como se lo haya indicado el fisioterapeuta. Estilo de vida  Mantenga un peso saludable. El sobrepeso sobrecarga la espalda y hace que resulte difcil tener una buena [place].  Evite actividades o situaciones que lo hagan sentirse ansioso o estresado. Aprenda maneras de manejar la ansiedad y el estrs. Una forma de controlar el estrs es a travs del ejercicio. El estrs y la ansiedad aumentan la tensin muscular y pueden empeorar el dolor de espalda. Instrucciones generales  Duerma sobre un colchn firme en una posicin cmoda. Intente acostarse de costado, con las rodillas ligeramente flexionadas. Si se recuesta [employer], coloque una almohada debajo de las rodillas.  Siga el plan de tratamiento como se lo haya indicado el mdico. Esto puede incluir lo siguiente: ? Terapia cognitiva o conductual. ?  Acupuntura o terapia de masajes. ? Yoga o meditacin. Comunquese con un mdico si:  Siente un dolor que no se alivia con reposo o medicamentos.  Siente mucho dolor que se extiende a las piernas o las nalgas.  El dolor no mejora en 2 semanas.  Siente dolor por la noche.  Pierde peso.  Tiene fiebre o siente escalofros. Solicite ayuda de inmediato si:  Tiene nuevos problemas para controlar la vejiga o los  intestinos.  Siente debilidad o adormecimiento inusuales en los brazos o en las piernas.  Siente nuseas o vmitos.  Siente dolor abdominal.  Siente que va a desmayarse. Resumen  Muchos adultos sufren dolor de espalda de vez en cuando. Es posible que se realicen un examen fsico, anlisis de laboratorio u otros estudios de diagnstico por imgenes para Animator causa del Social research officer, government.  Use tcnicas apropiadas para levantar objetos. Cuando se inclina y levanta un [place], utilice posiciones que no sobrecarguen tanto la espalda.  Tome los medicamentos de venta libre o recetados y aplique calor o hielo solamente como se lo haya indicado el mdico. Esta informacin no tiene Marine scientist el consejo del mdico. Asegrese de hacerle al mdico cualquier pregunta que tenga. Document Released: 04/16/2005 Document Revised: 08/06/2016 Document Reviewed: 08/06/2016 Elsevier Interactive Patient Education  [employer].        If you have lab work done today you will be contacted with your lab results within the next 2 weeks.  If you have not heard from [place] then please contact us. The fastest way to get your results is to register for My Chart.   IF you received an x-ray today, you will receive an invoice from [facility]. Please contact [facility] at [phone] with questions or concerns regarding your invoice.   IF you received labwork today, you will receive an invoice from [place]. Please contact [facility] at [phone] with questions or concerns regarding your invoice.   Our billing staff will not be able to assist you with questions regarding bills from these companies.  You will be contacted with the lab results as soon as they are available. The fastest way to get your results is to activate your My Chart account. Instructions are located on the last page of this paperwork. If you have not heard from [place] regarding the results in 2 weeks, please contact this  office.

## 2018-02-04 LAB — URINE CULTURE

## 2018-02-04 LAB — SEDIMENTATION RATE: Sed Rate: 28 mm/hr (ref 0–30)

## 2018-02-05 ENCOUNTER — Ambulatory Visit: Payer: 59 | Admitting: Family Medicine

## 2018-02-05 ENCOUNTER — Encounter: Payer: Self-pay | Admitting: Family Medicine

## 2018-02-05 VITALS — BP 128/83 | HR 80 | Temp 98.0°F | Resp 17 | Ht 60.0 in | Wt 157.0 lb

## 2018-02-05 DIAGNOSIS — M25552 Pain in left hip: Secondary | ICD-10-CM | POA: Diagnosis not present

## 2018-02-05 DIAGNOSIS — M545 Low back pain, unspecified: Secondary | ICD-10-CM

## 2018-02-05 DIAGNOSIS — M25551 Pain in right hip: Secondary | ICD-10-CM

## 2018-02-05 MED ORDER — CELECOXIB 200 MG PO CAPS: 200.0000 mg | ORAL_CAPSULE | Freq: Every day | ORAL | 0 refills | Status: DC | PRN

## 2018-02-05 MED ORDER — CYCLOBENZAPRINE HCL 5 MG PO TABS: 5.0000 mg | ORAL_TABLET | Freq: Every evening | ORAL | 0 refills | Status: DC | PRN

## 2018-02-05 NOTE — Progress Notes (Signed)
Subjective:  By signing my name below, I, Jacob Dixon, attest that this documentation has been prepared under the direction and in the presence of [name], MD Electronically Signed: Ladene Artist, ED Scribe 02/05/2018 at 5:24 PM.   Patient ID: Jacob Dixon, male    DOB: [birth year], 57 y.o.   MRN: [id number]  Chief Complaint  Patient presents with  . Follow-up    hip pain, back pain    HPI  Jacob Dixon is a 57 y.o. male who presents to Primary Care at [facility] for f/u on bilat hip and back pain. Last seen 2 days ago, noted pain x 2 wks bilat hips radiating to back. Was seen 9/27 at another clinic, started Celebrex 200 mg bid, reported worsening pain last OV. Reported subjective chills and night sweats once in prior wk. Sed rate was normal, CBC reassuring. Denies urinary symptoms, trace blood without nitrite or LE. Urine culture without infection. Continued on Celebrex, tramadol for acute pain relief prn. L-spine imaging in 2016 L3-4 disc space narrowing. Here for recheck.  Interpreter present. Pt states that back and bilat hip pain have improved since last OV; reports he is able to squat now. He took Celebrex for the past 2 days but not today due to vomiting and dizziness. He has not taken any meds today for pain. Denies vomiting prior to last OV when he was on Celebrex, suspects Tramadol is causing vomiting. Denies fever, rash or changes of skin, bladder/bowel incontinence, saddle anesthesia.  Pt does landscaping for work.  Patient Active Problem List   Diagnosis Date Noted  . Urinary tract infection due to extended-spectrum beta lactamase (ESBL) producing Escherichia coli 10/18/2017  . Lower urinary tract symptoms (LUTS) 10/18/2017  . Dysuria 10/08/2017  . Lower abdominal pain 10/08/2017  . Rectal pain 10/08/2017   Past Medical History:  Diagnosis Date  . Allergy   . Anemia   . Arthritis   . Neuromuscular disorder ([place])    No past surgical history on  file. No Known Allergies Prior to Admission medications   Medication Sig Start Date End Date Taking? Authorizing Provider  celecoxib (CELEBREX) 200 MG capsule TAKE 1 CAPSULE BY MOUTH TWICE DAILY AS NEEDED FOR PAIN WITH FOOD 01/24/18   [provider]  phenazopyridine (PYRIDIUM) 200 MG tablet Take 1 tablet (200 mg total) by mouth 3 (three) times daily as needed for pain. 10/08/17   Horald Pollen, MD  traMADol (ULTRAM) 50 MG tablet Take 1 tablet (50 mg total) by mouth every 6 (six) hours as needed. Label in [facility]. 02/03/18   [name], MD   Social History   Socioeconomic History  . Marital status: Married    Spouse name: Not on file  . Number of children: Not on file  . Years of education: Not on file  . Highest education level: Not on file  Occupational History  . Not on file  Social Needs  . Financial resource strain: Not on file  . Food insecurity:    Worry: Not on file    Inability: Not on file  . Transportation needs:    Medical: Not on file    Non-medical: Not on file  Tobacco Use  . Smoking status: Never Smoker  . Smokeless tobacco: Never Used  Substance and Sexual Activity  . Alcohol use: No  . Drug use: No  . Sexual activity: Not on file  Lifestyle  . Physical activity:    Days per week: Not on file  Minutes per session: Not on file  . Stress: Not on file  Relationships  . Social connections:    Talks on phone: Not on file    Gets together: Not on file    Attends religious service: Not on file    Active member of club or organization: Not on file    Attends meetings of clubs or organizations: Not on file    Relationship status: Not on file  . Intimate partner violence:    Fear of current or ex partner: Not on file    Emotionally abused: Not on file    Physically abused: Not on file    Forced sexual activity: Not on file  Other Topics Concern  . Not on file  Social History Narrative  . Not on file   Review of Systems   Constitutional: Negative for fever.  Gastrointestinal: Positive for vomiting.  Musculoskeletal: Positive for arthralgias (improved) and back pain (improved).  Skin: Negative for color change and rash.  Neurological: Positive for dizziness. Negative for numbness.      Objective:   Physical Exam  Constitutional: He is oriented to person, place, and time. He appears well-developed and well-nourished. No distress.  HENT:  Head: Normocephalic and atraumatic.  Eyes: Conjunctivae and EOM are normal.  Neck: Neck supple. No tracheal deviation present.  Cardiovascular: Normal rate.  Pulmonary/Chest: Effort normal. No respiratory distress.  Abdominal: Soft. He exhibits no distension. There is no tenderness.  Musculoskeletal: Normal range of motion.  Diffuse tenderness across lower L-spine L and R, more so than middle. Neg supine straight leg raise bilat. Pain free internal/external rotation and flexion of L hip. Slight pain with internal rotation of R hip.  Neurological: He is alert and oriented to person, place, and time.  Skin: Skin is warm and dry.  Psychiatric: He has a normal mood and affect. His behavior is normal.  Nursing note and vitals reviewed.  Vitals:   02/05/18 1700  BP: 128/83  Pulse: 80  Resp: 17  Temp: 98 F (36.7 C)  TempSrc: Oral  SpO2: 98%  Weight: 157 lb (71.2 kg)  Height: 5' (1.524 m)      Assessment & Plan:   Jacob Dixon is a 57 y.o. male Bilateral low back pain without sciatica, unspecified chronicity - Plan: celecoxib (CELEBREX) 200 MG capsule, cyclobenzaprine (FLEXERIL) 5 MG tablet  Bilateral hip pain - Plan: celecoxib (CELEBREX) 200 MG capsule  Improving low back pain and hip pain.  Inflammatory testing was reassuring.  Possible strain/overuse with this type of work.  Did not tolerate tramadol, will stop at this time.  Continue Celebrex as needed for now, 200 mg daily as needed.  Out of work note provided with anticipate return to work next week  as long the symptoms have improved.  RTC precautions if persistent/worsening.  Meds ordered this encounter  Medications  . celecoxib (CELEBREX) 200 MG capsule    Sig: Take 1 capsule (200 mg total) by mouth daily as needed.    Dispense:  20 capsule    Refill:  0  . cyclobenzaprine (FLEXERIL) 5 MG tablet    Sig: Take 1 tablet (5 mg total) by mouth at bedtime as needed.    Dispense:  15 tablet    Refill:  0   Patient Instructions       If you have lab work done today you will be contacted with your lab results within the next 2 weeks.  If you have not heard from [place] then  please contact us. The fastest way to get your results is to register for My Chart.   IF you received an x-ray today, you will receive an invoice from [facility]. Please contact [facility] at [phone] with questions or concerns regarding your invoice.   IF you received labwork today, you will receive an invoice from [place]. Please contact [facility] at [phone] with questions or concerns regarding your invoice.   Our billing staff will not be able to assist you with questions regarding bills from these companies.  You will be contacted with the lab results as soon as they are available. The fastest way to get your results is to activate your My Chart account. Instructions are located on the last page of this paperwork. If you have not heard from [place] regarding the results in 2 weeks, please contact this office.       I personally performed the services described in this documentation, which was scribed in my presence. The recorded information has been reviewed and considered for accuracy and completeness, addended by me as needed, and agree with information above.  Signed,   [name], MD Primary [facility] at [facility].  02/09/18 4:48 PM

## 2018-02-05 NOTE — Patient Instructions (Signed)
° ° ° °  If you have lab work done today you will be contacted with your lab results within the next 2 weeks.  If you have not heard from us then please contact us. The fastest way to get your results is to register for My Chart. ° ° °IF you received an x-ray today, you will receive an invoice from [facility]. Please contact [facility] at [phone] with questions or concerns regarding your invoice.  ° °IF you received labwork today, you will receive an invoice from [facility]. Please contact [facility] at [phone] with questions or concerns regarding your invoice.  ° °Our billing staff will not be able to assist you with questions regarding bills from these companies. ° °You will be contacted with the lab results as soon as they are available. The fastest way to get your results is to activate your My Chart account. Instructions are located on the last page of this paperwork. If you have not heard from us regarding the results in 2 weeks, please contact this office. °  ° ° ° °

## 2018-02-09 ENCOUNTER — Encounter: Payer: Self-pay | Admitting: Family Medicine

## 2018-02-10 ENCOUNTER — Encounter: Payer: Self-pay | Admitting: Physician Assistant

## 2018-02-10 ENCOUNTER — Ambulatory Visit: Payer: 59 | Admitting: Physician Assistant

## 2018-02-10 ENCOUNTER — Other Ambulatory Visit: Payer: Self-pay

## 2018-02-10 VITALS — BP 125/80 | HR 101 | Temp 98.6°F | Resp 18 | Ht 60.0 in | Wt 156.6 lb

## 2018-02-10 DIAGNOSIS — M25551 Pain in right hip: Secondary | ICD-10-CM | POA: Diagnosis not present

## 2018-02-10 DIAGNOSIS — M25512 Pain in left shoulder: Secondary | ICD-10-CM

## 2018-02-10 DIAGNOSIS — M25511 Pain in right shoulder: Secondary | ICD-10-CM

## 2018-02-10 DIAGNOSIS — T148XXA Other injury of unspecified body region, initial encounter: Secondary | ICD-10-CM

## 2018-02-10 DIAGNOSIS — M25552 Pain in left hip: Secondary | ICD-10-CM | POA: Diagnosis not present

## 2018-02-10 DIAGNOSIS — X503XXA Overexertion from repetitive movements, initial encounter: Secondary | ICD-10-CM

## 2018-02-10 NOTE — Patient Instructions (Addendum)
Sugiero usar Holiday representative de calentamiento o una bolsa de hielo en la zona afectada y Personnel officer objetos pesados durante los prximos 4-5 [place]. Usted puede aumentar celebrex y flexeril [place] los prximos das para ver si esto ayuda con el dolor. Si no hay mejora despus de 2 semanas, he colocado una referencia a la ortopedia. Si alguno de tus sntomas empeora o si presentas nuevos sntomas relacionados, por favor regresa al cargo.     I suggest using a heating pad or ice pack to the affected area and avoiding heavy lifting for the next 4-5 days. You may increase celebrex and flexeril up to twice daily for the next few days to see if this helps with pain. If no improvement after 2 weeks, I have placed a referral to orthopedics. If any of your symptoms worsen or you develop new concerning symptoms, please return to office.    RHCE para los cuidados de rutina de las lesiones (RICE for Routine Care of Injuries) Muchas lesiones pueden tratarse con reposo, hielo, compresin y elevacin (RHCE). Un plan RHCE puede ayudar a Best boy y reducir la hinchazn, y, adems, a que el cuerpo se recupere. Reposo Reduzca las actividades que realiza normalmente y evite usar la zona lesionada del cuerpo. Puede reanudar las actividades normales cuando se sienta bien y el mdico lo autorice. Hielo No se aplique hielo directamente sobre la piel.  Ponga el hielo en una bolsa plstica.  Coloque una toalla entre la piel y la bolsa de hielo.  Coloque el hielo durante 20 minutos, 2 a 3 veces por da. Hgalo durante el tiempo que el mdico se lo haya indicado. Compresin La compresin implica ejercer presin sobre la zona lesionada y se puede Optometrist con Management consultant. Si se coloc una venda elstica:  Qutese y vuelva a colocarse la venda cada 3 o 4horas, o como se lo haya indicado el mdico.  Asegrese de que la venda no est muy ajustada. Afljela si una zona del cuerpo  ms all de la venda se torna de color azul, est hinchada, se enfra o le causa dolor, o si pierde la sensibilidad en esa rea (adormecimiento).  Consulte al mdico si la venda parece [employer]. Elevacin La elevacin implica mantener elevada la zona lesionada. Eleve la zona lesionada por encima del nivel del corazn o del centro del pecho si puede hacerlo. [place]? Debe solicitar ayuda si:  El dolor y la hinchazn continan.  Los sntomas empeoran. [place] DE INMEDIATO? Debe obtener ayuda de inmediato en los siguientes casos:  Siente un dolor intenso repentino en la zona de la lesin o por debajo de esta.  Tiene irritacin o ms hinchazn alrededor de la lesin.  Tiene hormigueo o adormecimiento en la zona de la lesin o por debajo de esta que no desaparecen despus de quitarse la venda. Esta informacin no tiene Marine scientist el consejo del mdico. Asegrese de hacerle al mdico cualquier pregunta que tenga. Document Released: 07/13/2008 Document Revised: 07/09/2011 Document Reviewed: 03/24/2014 Elsevier Interactive Patient Education  2017 [employer].   IF you received an x-ray today, you will receive an invoice from [facility]. Please contact [facility] at [phone] with questions or concerns regarding your invoice.   IF you received labwork today, you will receive an invoice from [place]. Please contact [facility] at [phone] with questions or concerns regarding your invoice.   Our billing staff will not be able  to assist you with questions regarding bills from these companies.  You will be contacted with the lab results as soon as they are available. The fastest way to get your results is to activate your My Chart account. Instructions are located on the last page of this paperwork. If you have not heard from [place] regarding the results in 2 weeks, please contact this office.

## 2018-02-10 NOTE — Progress Notes (Signed)
Jacob Dixon  MRN: [id number] DOB: 1961-02-17  Subjective:  Jacob Dixon is a 57 y.o. male seen in office today for a chief complaint of b/l shoulder and hip pain x one week. Speaks spanish, stratus interpreter used [phone].    Seen in office on 02/03/18 and 02/05/18. Sed rate and CBC reassuring. Suspected overuse injury. Currently takes celebrex 200mg  daily and Flexeril 5 mg daily.  Pain is improving.  Back pain resolved.  Aggravated by lifting. 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 Constantly lifting, stooping, and doing overhead activity.  Denies overlying redness, warmth, fever, chills, nausea, vomiting, numbness, tingling, loss of range of motion.Has  PMH of "high uric acid and low arthritis." Has had a history of joint pain 5 years ago, resolved. He is not currently sexually active.   Review of Systems  Constitutional: Negative for chills, diaphoresis, fever and unexpected weight change.  Genitourinary: Negative for difficulty urinating, discharge, dysuria, frequency, penile pain, penile swelling, scrotal swelling and testicular pain.    Patient Active Problem List   Diagnosis Date Noted  . Urinary tract infection due to extended-spectrum beta lactamase (ESBL) producing Escherichia coli 10/18/2017  . Lower urinary tract symptoms (LUTS) 10/18/2017  . Dysuria 10/08/2017  . Lower abdominal pain 10/08/2017  . Rectal pain 10/08/2017    Current Outpatient Medications on File Prior to Visit  Medication Sig Dispense Refill  . celecoxib (CELEBREX) 200 MG capsule Take 1 capsule (200 mg total) by mouth daily as needed. 20 capsule 0  . cyclobenzaprine (FLEXERIL) 5 MG tablet Take 1 tablet (5 mg total) by mouth at bedtime as needed. 15 tablet 0  . phenazopyridine (PYRIDIUM) 200 MG tablet Take 1 tablet (200 mg total) by mouth 3 (three) times daily as needed for pain. 10 tablet 0  . traMADol (ULTRAM) 50 MG tablet Take 1 tablet (50 mg total) by mouth every 6 (six) hours as needed.  Label in [place]. 20 tablet 0   No current facility-administered medications on file prior to visit.     No Known Allergies    Social History   Socioeconomic History  . Marital status: Married    Spouse name: Not on file  . Number of children: Not on file  . Years of education: Not on file  . Highest education level: Not on file  Occupational History  . Not on file  Social Needs  . Financial resource strain: Not on file  . Food insecurity:    Worry: Not on file    Inability: Not on file  . Transportation needs:    Medical: Not on file    Non-medical: Not on file  Tobacco Use  . Smoking status: Never Smoker  . Smokeless tobacco: Never Used  Substance and Sexual Activity  . Alcohol use: No  . Drug use: No  . Sexual activity: Not on file  Lifestyle  . Physical activity:    Days per week: Not on file    Minutes per session: Not on file  . Stress: Not on file  Relationships  . Social connections:    Talks on phone: Not on file    Gets together: Not on file    Attends religious service: Not on file    Active member of club or organization: Not on file    Attends meetings of clubs or organizations: Not on file    Relationship status: Not on file  . Intimate partner violence:    Fear of current or ex partner: Not on  file    Emotionally abused: Not on file    Physically abused: Not on file    Forced sexual activity: Not on file  Other Topics Concern  . Not on file  Social History Narrative  . Not on file    Objective:  BP 125/80   Pulse (!) 101   Temp 98.6 F (37 C) (Oral)   Resp 18   Ht 5' (1.524 m)   Wt 156 lb 9.6 oz (71 kg)   SpO2 98%   BMI 30.58 kg/m   Physical Exam  Constitutional: He is oriented to person, place, and time. He appears well-developed and well-nourished. No distress.  HENT:  Head: Normocephalic and atraumatic.  Eyes: Conjunctivae are normal.  Neck: Normal range of motion.  Pulmonary/Chest: Effort normal.  Musculoskeletal:        Right shoulder: He exhibits tenderness (mild TTP lateral shoulder ). He exhibits normal range of motion, no bony tenderness, no swelling, no crepitus, no spasm, normal pulse and normal strength.       Left shoulder: He exhibits tenderness (mild TTP lateral shoulder ). He exhibits normal range of motion, no bony tenderness, no crepitus, no deformity, normal pulse and normal strength.       Right elbow: Normal.      Left elbow: Normal.       Right wrist: Normal.       Left wrist: Normal.       Right hip: He exhibits tenderness (mild TTP with palpation of skin overlying trochanteric bursa). He exhibits normal range of motion, normal strength, no bony tenderness, no swelling and no crepitus.       Left hip: He exhibits tenderness (mild TTP with palpation of skin overlying trochanteric bursa). He exhibits normal range of motion, normal strength, no bony tenderness, no swelling and no crepitus.       Right knee: Normal.       Left knee: Normal.       Right ankle: Normal.       Left ankle: Normal.       Lumbar back: Normal.       Right hand: Normal.       Left hand: Normal.  Neurological: He is alert and oriented to person, place, and time. He has normal strength and normal reflexes. No sensory deficit. Gait normal.  Skin: Skin is warm and dry.  No overlying erythema or warmth noted on skin exam.   Psychiatric: He has a normal mood and affect.  Vitals reviewed.   Assessment and Plan :  1. Bilateral hip pain - Ambulatory referral to Orthopedic Surgery 2. Acute pain of both shoulders - Ambulatory referral to Orthopedic Surgery 3. Overuse injury Patient is overall well-appearing, no acute distress.  Labs from most recent visits reviewed and reassuring.  Patient has mild TTP with palpation of the lateral aspect of bilateral shoulders and hips.  No overlying erythema, warmth, or redness.  Suspect diffuse bursitis from overuse at work.  He is constantly doing repetitive motions at work.  I suggest  rest, avoiding repetitive motions, heating pad/ice pack to affected area, may increase Celebrex and Flexeril to twice daily.  Follow-up with orthopedics if no improvement.  Given strict return precautions. - Ambulatory referral to [place] PA-C  Primary [facility] at [place] 02/10/2018 11:08 AM

## 2018-02-18 ENCOUNTER — Ambulatory Visit (INDEPENDENT_AMBULATORY_CARE_PROVIDER_SITE_OTHER): Payer: Self-pay

## 2018-02-18 ENCOUNTER — Encounter (INDEPENDENT_AMBULATORY_CARE_PROVIDER_SITE_OTHER): Payer: Self-pay | Admitting: Orthopaedic Surgery

## 2018-02-18 ENCOUNTER — Encounter (INDEPENDENT_AMBULATORY_CARE_PROVIDER_SITE_OTHER): Payer: Self-pay

## 2018-02-18 ENCOUNTER — Ambulatory Visit (INDEPENDENT_AMBULATORY_CARE_PROVIDER_SITE_OTHER): Payer: 59 | Admitting: Orthopaedic Surgery

## 2018-02-18 DIAGNOSIS — M25511 Pain in right shoulder: Secondary | ICD-10-CM

## 2018-02-18 DIAGNOSIS — M25512 Pain in left shoulder: Secondary | ICD-10-CM | POA: Diagnosis not present

## 2018-02-18 DIAGNOSIS — M25552 Pain in left hip: Secondary | ICD-10-CM

## 2018-02-18 DIAGNOSIS — G8929 Other chronic pain: Secondary | ICD-10-CM

## 2018-02-18 DIAGNOSIS — M25551 Pain in right hip: Secondary | ICD-10-CM | POA: Diagnosis not present

## 2018-02-18 DIAGNOSIS — M545 Low back pain: Secondary | ICD-10-CM | POA: Diagnosis not present

## 2018-02-18 NOTE — Progress Notes (Signed)
Office Visit Note   Patient: Jacob Dixon           Date of Birth: 21-Oct-1960           MRN: [id number] Visit Date: 02/18/2018              Requested by: Leonie Douglas, PA-C [place], [place] [address] PCP: Horald Pollen, MD   Assessment & Plan: Visit Diagnoses:  1. Chronic pain of both shoulders   2. Bilateral hip pain     Plan: Impression is bilateral hip early osteoarthritis and bilateral shoulder subacromial bursitis.  In regards to the shoulders, we will inject both subacromial spaces with cortisone today.  He will follow-up with [place] as needed.  In regards to the hips, we will refer him to Dr. Junius Roads for ultrasound-guided cortisone injections.  Follow-Up Instructions: Return if symptoms worsen or fail to improve.   Orders:  Orders Placed This Encounter  Procedures  . Large Joint Inj: bilateral subacromial bursa  . XR Shoulder Left  . XR Shoulder Right  . XR Pelvis 1-2 Views  . XR Lumbar Spine 2-3 Views  . XR HIPS BILAT W OR W/O PELVIS 3-4 VIEWS   No orders of the defined types were placed in this encounter.     Procedures: Large Joint Inj: bilateral subacromial bursa on 02/18/2018 5:00 PM Indications: pain Details: 22 G needle Outcome: tolerated well, no immediate complications Patient was prepped and draped in the usual sterile fashion.       Clinical Data: No additional findings.   Subjective: Chief Complaint  Patient presents with  . Right Shoulder - Pain  . Left Shoulder - Pain  . Right Hip - Pain  . Left Hip - Pain    HPI patient is a pleasant 57 year old Spanish-speaking gentleman who presents to our clinic today with bilateral shoulder and bilateral hip pain.  In regards to his shoulders both hurt equally as bad.  This has been ongoing for the past 5 weeks and has significantly worsened.  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  The pain is worse  sleeping on either side.  He describes this as a throb.  He has tried Flexeril as well as Celebrex with no relief of symptoms.  He does note numbness and tingling to his fingers on occasion.  No previous cortisone injection either shoulder.  In regards to both hips, the pain he has is to the groin as well as the buttocks.  No pain to the lateral aspect.  Pain is worse when lying in a supine position.  No numbness tingling or burning down the leg  Review of Systems as detailed in HPI.  All others reviewed and are negative.   Objective: Vital Signs: There were no vitals taken for this visit.  Physical Exam well-developed well-nourished gentleman no acute distress.  Alert and oriented x3  Ortho Exam examination of both shoulders reveals full active range of motion in all planes although he does have pain with the extremes of internal rotation.  Markedly positive empty can.  He is neurovascular intact distally.  Positive logroll.  Negative straight leg raise.  Minimal tenderness to both trigger traverses.  Specialty Comments:  No specialty comments available.  Imaging: Xr Hips Bilat W Or W/o Pelvis 3-4 Views  Result Date: 02/18/2018 Mild to moderate joint space narrowing both hips  Xr Lumbar Spine 2-3 Views  Result  Date: 02/18/2018 Abnormal straightening of the cervical spine.  Minimal  Xr Pelvis 1-2 Views  Result Date: 02/18/2018 Bilateral coxa vara.  Mild osteoarthritis.  Xr Shoulder Left  Result Date: 02/18/2018 No acute or structural abnormalities  Xr Shoulder Right  Result Date: 02/18/2018 No acute or structural abnormalities    PMFS History: Patient Active Problem List   Diagnosis Date Noted  . Bilateral hip pain 02/18/2018  . Urinary tract infection due to extended-spectrum beta lactamase (ESBL) producing Escherichia coli 10/18/2017  . Lower urinary tract symptoms (LUTS) 10/18/2017  . Dysuria 10/08/2017  . Lower abdominal pain 10/08/2017  . Rectal pain 10/08/2017   . Chronic pain of both shoulders 11/23/2015   Past Medical History:  Diagnosis Date  . Allergy   . Anemia   . Arthritis   . Neuromuscular disorder ([place])     Family History  Problem Relation Age of Onset  . Cancer Mother     History reviewed. No pertinent surgical history. Social History   Occupational History  . Not on file  Tobacco Use  . Smoking status: Never Smoker  . Smokeless tobacco: Never Used  Substance and Sexual Activity  . Alcohol use: No  . Drug use: No  . Sexual activity: Not on file

## 2018-02-19 ENCOUNTER — Encounter (INDEPENDENT_AMBULATORY_CARE_PROVIDER_SITE_OTHER): Payer: Self-pay | Admitting: Family Medicine

## 2018-02-19 ENCOUNTER — Ambulatory Visit (INDEPENDENT_AMBULATORY_CARE_PROVIDER_SITE_OTHER): Payer: 59 | Admitting: Family Medicine

## 2018-02-19 DIAGNOSIS — M1612 Unilateral primary osteoarthritis, left hip: Secondary | ICD-10-CM | POA: Diagnosis not present

## 2018-02-19 DIAGNOSIS — M1611 Unilateral primary osteoarthritis, right hip: Secondary | ICD-10-CM

## 2018-02-19 MED ORDER — METHYLPREDNISOLONE ACETATE 40 MG/ML IJ SUSP
40.0000 mg | Freq: Once | INTRAMUSCULAR | Status: DC
Start: 2018-02-19 — End: 2018-03-31

## 2018-02-19 MED ORDER — METHYLPREDNISOLONE ACETATE 40 MG/ML IJ SUSP: 40.0000 mg | Freq: Once | INTRAMUSCULAR | Status: DC

## 2018-02-19 NOTE — Progress Notes (Signed)
   Office Visit Note   Patient: Jacob Dixon           Date of Birth: Sep 14, 1960           MRN: [id number] Visit Date: 02/19/2018 Requested by: Horald Pollen, MD [place], [place] [address] PCP: Horald Pollen, MD  Subjective: Chief Complaint  Patient presents with  . bilateral hip injections under ultrasound    HPI: He is here as scheduled for bilateral hip injections.  He has an interpreter with him today.  His hips have been bothering him for several months, no injury.  The right one started first and then the left.  Bilateral groin pain, stiffness when he first starts walking.  His shoulders were injected yesterday and are already feeling better.  He still would like to proceed with hip injections.  Upon questioning, he notes that he has been diagnosed with gout in the past.  He wondered whether it might be contributing to his current pain.  X-rays of his hips from yesterday show on 1 of the external rotation hip views, there appears to be chondrocalcinosis at the inferior aspect of the acetabulum.  He has not had a uric acid level checked in quite a while.  He no longer takes allopurinol.              ROS: Otherwise noncontributory  Objective: Vital Signs: There were no vitals taken for this visit.  Physical Exam:  Hips: Both of them have pain with passive flexion and internal rotation, although his range of motion is still good.  Imaging: None today.  Assessment & Plan: 1.  Bilateral hip pain with early arthritis on x-ray, question whether related to gout -Discussed options with patient, elected to inject both of his hips under ultrasound guidance today.  If he has recurrent pain, I would suggest checking uric acid level and possibly ANA, CCP antibodies, rheumatoid factor.   Follow-Up Instructions: No follow-ups on file.       Procedures: Bilateral ultrasound-guided hip injections: After sterile prep with Betadine, injected 8 cc 1% lidocaine  without epinephrine and 40 mg methylprednisolone using ultrasound to guide needle placement.  22-gauge spinal needle was passed through the iliofemoral ligament into the femoral head/neck junction of each hip.  Injectate was seen filling each joint capsule.  He had very good pain relief during the anesthetic phase.   PMFS History: Patient Active Problem List   Diagnosis Date Noted  . Bilateral hip pain 02/18/2018  . Urinary tract infection due to extended-spectrum beta lactamase (ESBL) producing Escherichia coli 10/18/2017  . Lower urinary tract symptoms (LUTS) 10/18/2017  . Dysuria 10/08/2017  . Lower abdominal pain 10/08/2017  . Rectal pain 10/08/2017  . Chronic pain of both shoulders 11/23/2015   Past Medical History:  Diagnosis Date  . Allergy   . Anemia   . Arthritis   . Neuromuscular disorder ([place])     Family History  Problem Relation Age of Onset  . Cancer Mother     No past surgical history on file. Social History   Occupational History  . Not on file  Tobacco Use  . Smoking status: Never Smoker  . Smokeless tobacco: Never Used  Substance and Sexual Activity  . Alcohol use: No  . Drug use: No  . Sexual activity: Not on file

## 2018-02-28 ENCOUNTER — Ambulatory Visit (INDEPENDENT_AMBULATORY_CARE_PROVIDER_SITE_OTHER): Payer: 59 | Admitting: Physician Assistant

## 2018-03-11 ENCOUNTER — Encounter (INDEPENDENT_AMBULATORY_CARE_PROVIDER_SITE_OTHER): Payer: Self-pay | Admitting: Orthopaedic Surgery

## 2018-03-11 ENCOUNTER — Ambulatory Visit (INDEPENDENT_AMBULATORY_CARE_PROVIDER_SITE_OTHER): Payer: 59 | Admitting: Orthopaedic Surgery

## 2018-03-11 DIAGNOSIS — M25512 Pain in left shoulder: Secondary | ICD-10-CM

## 2018-03-11 DIAGNOSIS — M25511 Pain in right shoulder: Secondary | ICD-10-CM | POA: Diagnosis not present

## 2018-03-11 DIAGNOSIS — G8929 Other chronic pain: Secondary | ICD-10-CM

## 2018-03-11 NOTE — Progress Notes (Signed)
   Office Visit Note   Patient: Jacob Dixon           Date of Birth: 1960-05-01           MRN: [id number] Visit Date: 03/11/2018              Requested by: Horald Pollen, MD [place], [place] [address] PCP: Horald Pollen, MD   Assessment & Plan: Visit Diagnoses:  1. Chronic pain of both shoulders     Plan: Impression is bilateral shoulder questionable rotator cuff tears.  At this point, we will obtain MRIs of both shoulders to further assess his rotator cuff.  He will follow-up with [place] once that has been completed.  Follow-Up Instructions: Return in about 10 days (around 03/21/2018) for discuss mri.   Orders:  Orders Placed This Encounter  Procedures  . MR SHOULDER RIGHT WO CONTRAST  . MR Shoulder Left w/o contrast   No orders of the defined types were placed in this encounter.     Procedures: No procedures performed   Clinical Data: No additional findings.   Subjective: Chief Complaint  Patient presents with  . Right Shoulder - Pain  . Left Shoulder - Pain    HPI patient is a pleasant 57 year old gentleman who comes in today with a Spanish speaking interpreter for follow-up of bilateral shoulder pain.  He underwent bilateral subacromial cortisone injections on 02/18/2018.  He noted significant relief of symptoms but these only lasted for about a week on each side.  His pain has returned and is started to worsen.  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  Unable to do this right now due to the pain.  Review of Systems as detailed in HPI.  All others reviewed and are negative.   Objective: Vital Signs: There were no vitals taken for this visit.  Physical Exam well-developed and well-nourished gentleman in no acute distress.  Alert and oriented x3  Ortho Exam stable exam of both shoulders  Specialty Comments:  No specialty comments available.  Imaging: No new imaging   PMFS History: Patient Active  Problem List   Diagnosis Date Noted  . Bilateral hip pain 02/18/2018  . Urinary tract infection due to extended-spectrum beta lactamase (ESBL) producing Escherichia coli 10/18/2017  . Lower urinary tract symptoms (LUTS) 10/18/2017  . Dysuria 10/08/2017  . Lower abdominal pain 10/08/2017  . Rectal pain 10/08/2017  . Chronic pain of both shoulders 11/23/2015   Past Medical History:  Diagnosis Date  . Allergy   . Anemia   . Arthritis   . Neuromuscular disorder ([place])     Family History  Problem Relation Age of Onset  . Cancer Mother     History reviewed. No pertinent surgical history. Social History   Occupational History  . Not on file  Tobacco Use  . Smoking status: Never Smoker  . Smokeless tobacco: Never Used  Substance and Sexual Activity  . Alcohol use: No  . Drug use: No  . Sexual activity: Not on file

## 2018-03-16 ENCOUNTER — Ambulatory Visit
Admission: RE | Admit: 2018-03-16 | Discharge: 2018-03-16 | Disposition: A | Discharge status: Home / Self Care | Payer: 59 | Source: Ambulatory Visit | Attending: Orthopaedic Surgery | Admitting: Orthopaedic Surgery

## 2018-03-16 DIAGNOSIS — M25512 Pain in left shoulder: Principal | ICD-10-CM

## 2018-03-16 DIAGNOSIS — G8929 Other chronic pain: Secondary | ICD-10-CM

## 2018-03-16 DIAGNOSIS — M25511 Pain in right shoulder: Secondary | ICD-10-CM | POA: Diagnosis not present

## 2018-03-25 ENCOUNTER — Ambulatory Visit (INDEPENDENT_AMBULATORY_CARE_PROVIDER_SITE_OTHER): Payer: 59 | Admitting: Orthopaedic Surgery

## 2018-03-25 DIAGNOSIS — M75111 Incomplete rotator cuff tear or rupture of right shoulder, not specified as traumatic: Secondary | ICD-10-CM | POA: Insufficient documentation

## 2018-03-25 MED ORDER — TRAMADOL HCL 50 MG PO TABS: 50.0000 mg | ORAL_TABLET | Freq: Two times a day (BID) | ORAL | 0 refills | Status: DC

## 2018-03-25 NOTE — Progress Notes (Signed)
      Patient: Jacob Dixon           Date of Birth: 03/20/1961           MRN: [id number] Visit Date: 03/25/2018 PCP: Horald Pollen, MD   Assessment & Plan:  Chief Complaint:  Chief Complaint  Patient presents with  . Right Shoulder - Follow-up   Visit Diagnoses:  1. Nontraumatic incomplete tear of right rotator cuff     Plan: Patient is a pleasant 57 year old Spanish-speaking gentleman who presents to our clinic today to discuss MRI results of the right shoulder.  He has had ongoing bilateral shoulder pain right greater than left for a while now.  I saw him a little over a month ago where we injected his right subacromial space with cortisone.  He had significant relief, however this only lasted for about a week.  Further imaging study to include MRI was obtained which showed a high-grade partial-thickness bursal surface tear supraspinatus and possible posterior labral tear.  At this point, the patient would like to proceed with definitive treatment to include right shoulder arthroscopic debridement and possible rotator cuff repair.  Risks, benefits, complications reviewed.  Rehab recovery time discussed.  All questions were answered.  This was discussed with our assistant was who stepped and is the [place] interpreter today. Total face to face encounter time was greater than 25 minutes and over half of this time was spent in counseling and/or coordination of care.  Follow-Up Instructions: Return for 2 weeks post-op.   Orders:  No orders of the defined types were placed in this encounter.  Meds ordered this encounter  Medications  . traMADol (ULTRAM) 50 MG tablet    Sig: Take 1 tablet (50 mg total) by mouth 2 (two) times daily.    Dispense:  30 tablet    Refill:  0    Imaging: No new imaging  PMFS History: Patient Active Problem List   Diagnosis Date Noted  . Nontraumatic incomplete tear of right rotator cuff 03/25/2018  . Bilateral hip pain 02/18/2018  .  Urinary tract infection due to extended-spectrum beta lactamase (ESBL) producing Escherichia coli 10/18/2017  . Lower urinary tract symptoms (LUTS) 10/18/2017  . Dysuria 10/08/2017  . Lower abdominal pain 10/08/2017  . Rectal pain 10/08/2017  . Chronic pain of both shoulders 11/23/2015   Past Medical History:  Diagnosis Date  . Allergy   . Anemia   . Arthritis   . Neuromuscular disorder ([place])     Family History  Problem Relation Age of Onset  . Cancer Mother     No past surgical history on file. Social History   Occupational History  . Not on file  Tobacco Use  . Smoking status: Never Smoker  . Smokeless tobacco: Never Used  Substance and Sexual Activity  . Alcohol use: No  . Drug use: No  . Sexual activity: Not on file

## 2018-03-30 DIAGNOSIS — M7541 Impingement syndrome of right shoulder: Secondary | ICD-10-CM

## 2018-03-30 HISTORY — DX: Impingement syndrome of right shoulder: M75.41

## 2018-03-31 ENCOUNTER — Encounter (HOSPITAL_BASED_OUTPATIENT_CLINIC_OR_DEPARTMENT_OTHER): Payer: Self-pay | Admitting: *Deleted

## 2018-03-31 ENCOUNTER — Other Ambulatory Visit: Payer: Self-pay

## 2018-04-07 ENCOUNTER — Encounter (HOSPITAL_BASED_OUTPATIENT_CLINIC_OR_DEPARTMENT_OTHER): Payer: Self-pay | Admitting: Anesthesiology

## 2018-04-07 ENCOUNTER — Encounter (HOSPITAL_BASED_OUTPATIENT_CLINIC_OR_DEPARTMENT_OTHER)
Admission: RE | Disposition: A | Discharge status: Home / Self Care | Payer: Self-pay | Source: Ambulatory Visit | Attending: Orthopaedic Surgery

## 2018-04-07 ENCOUNTER — Ambulatory Visit (HOSPITAL_BASED_OUTPATIENT_CLINIC_OR_DEPARTMENT_OTHER): Payer: 59 | Admitting: Anesthesiology

## 2018-04-07 ENCOUNTER — Encounter: Payer: Self-pay | Admitting: Orthopaedic Surgery

## 2018-04-07 ENCOUNTER — Ambulatory Visit (HOSPITAL_BASED_OUTPATIENT_CLINIC_OR_DEPARTMENT_OTHER)
Admission: RE | Admit: 2018-04-07 | Discharge: 2018-04-07 | Disposition: A | Discharge status: Home / Self Care | Payer: 59 | Source: Ambulatory Visit | Attending: Orthopaedic Surgery | Admitting: Orthopaedic Surgery

## 2018-04-07 ENCOUNTER — Other Ambulatory Visit: Payer: Self-pay

## 2018-04-07 DIAGNOSIS — M75111 Incomplete rotator cuff tear or rupture of right shoulder, not specified as traumatic: Secondary | ICD-10-CM | POA: Diagnosis not present

## 2018-04-07 DIAGNOSIS — M7541 Impingement syndrome of right shoulder: Secondary | ICD-10-CM | POA: Insufficient documentation

## 2018-04-07 DIAGNOSIS — M19011 Primary osteoarthritis, right shoulder: Secondary | ICD-10-CM | POA: Diagnosis not present

## 2018-04-07 DIAGNOSIS — M7521 Bicipital tendinitis, right shoulder: Secondary | ICD-10-CM | POA: Diagnosis not present

## 2018-04-07 DIAGNOSIS — G8918 Other acute postprocedural pain: Secondary | ICD-10-CM | POA: Diagnosis not present

## 2018-04-07 DIAGNOSIS — Z791 Long term (current) use of non-steroidal anti-inflammatories (NSAID): Secondary | ICD-10-CM | POA: Diagnosis not present

## 2018-04-07 DIAGNOSIS — S43431A Superior glenoid labrum lesion of right shoulder, initial encounter: Secondary | ICD-10-CM | POA: Diagnosis not present

## 2018-04-07 DIAGNOSIS — K219 Gastro-esophageal reflux disease without esophagitis: Secondary | ICD-10-CM | POA: Diagnosis not present

## 2018-04-07 DIAGNOSIS — M24111 Other articular cartilage disorders, right shoulder: Secondary | ICD-10-CM | POA: Diagnosis not present

## 2018-04-07 DIAGNOSIS — M109 Gout, unspecified: Secondary | ICD-10-CM | POA: Diagnosis not present

## 2018-04-07 DIAGNOSIS — M75101 Unspecified rotator cuff tear or rupture of right shoulder, not specified as traumatic: Secondary | ICD-10-CM | POA: Diagnosis not present

## 2018-04-07 DIAGNOSIS — M67921 Unspecified disorder of synovium and tendon, right upper arm: Secondary | ICD-10-CM

## 2018-04-07 DIAGNOSIS — Z79899 Other long term (current) drug therapy: Secondary | ICD-10-CM | POA: Diagnosis not present

## 2018-04-07 HISTORY — DX: Gout, unspecified: M10.9

## 2018-04-07 HISTORY — PX: BICEPT TENODESIS: SHX5116

## 2018-04-07 HISTORY — DX: Impingement syndrome of right shoulder: M75.41

## 2018-04-07 SURGERY — SHOULDER ARTHROSCOPY WITH SUBACROMIAL DECOMPRESSION AND DISTAL CLAVICLE EXCISION
Anesthesia: General | Site: Shoulder | Laterality: Right

## 2018-04-07 MED ORDER — BUPIVACAINE HCL (PF) 0.5 % IJ SOLN
INTRAMUSCULAR | Status: DC | PRN
  Administered 2018-04-07: 15 mL via PERINEURAL

## 2018-04-07 MED ORDER — ONDANSETRON HCL 4 MG/2ML IJ SOLN: 4.0000 mg | Freq: Once | INTRAMUSCULAR | Status: DC | PRN

## 2018-04-07 MED ORDER — ROCURONIUM BROMIDE 100 MG/10ML IV SOLN
INTRAVENOUS | Status: DC | PRN
  Administered 2018-04-07: 50 mg via INTRAVENOUS

## 2018-04-07 MED ORDER — BUPIVACAINE LIPOSOME 1.3 % IJ SUSP
INTRAMUSCULAR | Status: DC | PRN
  Administered 2018-04-07: 10 mL

## 2018-04-07 MED ORDER — SCOPOLAMINE 1 MG/3DAYS TD PT72: 1.0000 | MEDICATED_PATCH | Freq: Once | TRANSDERMAL | Status: DC | PRN

## 2018-04-07 MED ORDER — SENNOSIDES-DOCUSATE SODIUM 8.6-50 MG PO TABS: 1.0000 | ORAL_TABLET | Freq: Every evening | ORAL | 1 refills | Status: DC | PRN

## 2018-04-07 MED ORDER — SUGAMMADEX SODIUM 200 MG/2ML IV SOLN
INTRAVENOUS | Status: DC | PRN
  Administered 2018-04-07: 200 mg via INTRAVENOUS

## 2018-04-07 MED ORDER — LIDOCAINE 2% (20 MG/ML) 5 ML SYRINGE
INTRAMUSCULAR | Status: AC
  Filled 2018-04-07: qty 5

## 2018-04-07 MED ORDER — PROPOFOL 500 MG/50ML IV EMUL
INTRAVENOUS | Status: AC
  Filled 2018-04-07: qty 50

## 2018-04-07 MED ORDER — ONDANSETRON HCL 4 MG PO TABS: 4.0000 mg | ORAL_TABLET | Freq: Three times a day (TID) | ORAL | 0 refills | Status: DC | PRN

## 2018-04-07 MED ORDER — CHLORHEXIDINE GLUCONATE 4 % EX LIQD: 60.0000 mL | Freq: Once | CUTANEOUS | Status: DC

## 2018-04-07 MED ORDER — DEXAMETHASONE SODIUM PHOSPHATE 4 MG/ML IJ SOLN
INTRAMUSCULAR | Status: DC | PRN
  Administered 2018-04-07: 10 mg via INTRAVENOUS

## 2018-04-07 MED ORDER — EPINEPHRINE PF 1 MG/ML IJ SOLN
INTRAMUSCULAR | Status: DC | PRN
  Administered 2018-04-07: 1 mg

## 2018-04-07 MED ORDER — OXYCODONE HCL 5 MG PO TABS: 5.0000 mg | ORAL_TABLET | Freq: Once | ORAL | Status: DC | PRN

## 2018-04-07 MED ORDER — MIDAZOLAM HCL 2 MG/2ML IJ SOLN
INTRAMUSCULAR | Status: AC
  Filled 2018-04-07: qty 2

## 2018-04-07 MED ORDER — ONDANSETRON HCL 4 MG/2ML IJ SOLN
INTRAMUSCULAR | Status: DC | PRN
  Administered 2018-04-07: 4 mg via INTRAVENOUS

## 2018-04-07 MED ORDER — LACTATED RINGERS IV SOLN: INTRAVENOUS | Status: DC

## 2018-04-07 MED ORDER — FENTANYL CITRATE (PF) 100 MCG/2ML IJ SOLN: 25.0000 ug | INTRAMUSCULAR | Status: DC | PRN

## 2018-04-07 MED ORDER — FENTANYL CITRATE (PF) 100 MCG/2ML IJ SOLN
50.0000 ug | INTRAMUSCULAR | Status: DC | PRN
  Administered 2018-04-07: 100 ug via INTRAVENOUS

## 2018-04-07 MED ORDER — METHOCARBAMOL 750 MG PO TABS: 750.0000 mg | ORAL_TABLET | Freq: Two times a day (BID) | ORAL | 0 refills | Status: DC | PRN

## 2018-04-07 MED ORDER — OXYCODONE HCL 5 MG/5ML PO SOLN: 5.0000 mg | Freq: Once | ORAL | Status: DC | PRN

## 2018-04-07 MED ORDER — DEXAMETHASONE SODIUM PHOSPHATE 10 MG/ML IJ SOLN
INTRAMUSCULAR | Status: AC
  Filled 2018-04-07: qty 1

## 2018-04-07 MED ORDER — ONDANSETRON HCL 4 MG/2ML IJ SOLN
INTRAMUSCULAR | Status: AC
  Filled 2018-04-07: qty 2

## 2018-04-07 MED ORDER — EPINEPHRINE 30 MG/30ML IJ SOLN
INTRAMUSCULAR | Status: AC
  Filled 2018-04-07: qty 1

## 2018-04-07 MED ORDER — PROPOFOL 10 MG/ML IV BOLUS
INTRAVENOUS | Status: DC | PRN
  Administered 2018-04-07: 150 mg via INTRAVENOUS

## 2018-04-07 MED ORDER — OXYCODONE-ACETAMINOPHEN 5-325 MG PO TABS: 1.0000 | ORAL_TABLET | Freq: Four times a day (QID) | ORAL | 0 refills | Status: DC | PRN

## 2018-04-07 MED ORDER — MIDAZOLAM HCL 2 MG/2ML IJ SOLN
1.0000 mg | INTRAMUSCULAR | Status: DC | PRN
  Administered 2018-04-07: 2 mg via INTRAVENOUS

## 2018-04-07 MED ORDER — LACTATED RINGERS IV SOLN
INTRAVENOUS | Status: DC
  Administered 2018-04-07: 10 mL/h via INTRAVENOUS
  Administered 2018-04-07: 14:00:00 via INTRAVENOUS

## 2018-04-07 MED ORDER — CEFAZOLIN SODIUM-DEXTROSE 2-4 GM/100ML-% IV SOLN
INTRAVENOUS | Status: AC
  Filled 2018-04-07: qty 100

## 2018-04-07 MED ORDER — LIDOCAINE 2% (20 MG/ML) 5 ML SYRINGE
INTRAMUSCULAR | Status: DC | PRN
  Administered 2018-04-07: 100 mg via INTRAVENOUS

## 2018-04-07 MED ORDER — CEFAZOLIN SODIUM-DEXTROSE 2-4 GM/100ML-% IV SOLN
2.0000 g | INTRAVENOUS | Status: AC
  Administered 2018-04-07: 2 g via INTRAVENOUS

## 2018-04-07 MED ORDER — FENTANYL CITRATE (PF) 100 MCG/2ML IJ SOLN
INTRAMUSCULAR | Status: AC
  Filled 2018-04-07: qty 2

## 2018-04-07 SURGICAL SUPPLY — 72 items
ANCH SUT 4 2 THRD PEEK (Anchor) ×2 IMPLANT
ANCHOR SUT CROSSFT KNTLS 4.0 (Anchor) ×1 IMPLANT
APL SKNCLS STERI-STRIP NONHPOA (GAUZE/BANDAGES/DRESSINGS)
BENZOIN TINCTURE PRP APPL 2/3 (GAUZE/BANDAGES/DRESSINGS) IMPLANT
BLADE 4.2CUDA (BLADE) ×3 IMPLANT
BLADE CUTTER GATOR 3.5 (BLADE) IMPLANT
BLADE GREAT WHITE 4.2 (BLADE) IMPLANT
BLADE SURG 15 STRL LF DISP TIS (BLADE) IMPLANT
BLADE SURG 15 STRL SS (BLADE)
BUR OVAL 4.0 (BURR) ×3 IMPLANT
CANNULA 5.75X71 LONG (CANNULA) ×3 IMPLANT
CANNULA SHOULDER 7CM (CANNULA) ×1 IMPLANT
CANNULA TWIST IN 8.25X7CM (CANNULA) IMPLANT
CANNULA TWIST IN 8.25X9CM (CANNULA) IMPLANT
CLSR STERI-STRIP ANTIMIC 1/2X4 (GAUZE/BANDAGES/DRESSINGS) IMPLANT
COVER WAND RF STERILE (DRAPES) IMPLANT
DECANTER SPIKE VIAL GLASS SM (MISCELLANEOUS) IMPLANT
DRAPE IMP U-DRAPE 54X76 (DRAPES) ×3 IMPLANT
DRAPE INCISE IOBAN 66X45 STRL (DRAPES) ×3 IMPLANT
DRAPE STERI 35X30 U-POUCH (DRAPES) ×3 IMPLANT
DRAPE SURG 17X23 STRL (DRAPES) ×3 IMPLANT
DRAPE U-SHAPE 47X51 STRL (DRAPES) ×3 IMPLANT
DRAPE U-SHAPE 76X120 STRL (DRAPES) ×6 IMPLANT
DRSG PAD ABDOMINAL 8X10 ST (GAUZE/BANDAGES/DRESSINGS) ×6 IMPLANT
DURAPREP 26ML APPLICATOR (WOUND CARE) ×6 IMPLANT
ELECT REM PT RETURN 9FT ADLT (ELECTROSURGICAL)
ELECTRODE REM PT RTRN 9FT ADLT (ELECTROSURGICAL) IMPLANT
GAUZE SPONGE 4X4 12PLY STRL (GAUZE/BANDAGES/DRESSINGS) ×6 IMPLANT
GAUZE XEROFORM 1X8 LF (GAUZE/BANDAGES/DRESSINGS) ×3 IMPLANT
GLOVE BIO SURGEON STRL SZ 6.5 (GLOVE) ×1 IMPLANT
GLOVE BIOGEL PI IND STRL 7.0 (GLOVE) ×2 IMPLANT
GLOVE BIOGEL PI INDICATOR 7.0 (GLOVE) ×2
GLOVE ECLIPSE 7.0 STRL STRAW (GLOVE) ×3 IMPLANT
GLOVE SKINSENSE NS SZ7.5 (GLOVE) ×1
GLOVE SKINSENSE STRL SZ7.5 (GLOVE) ×2 IMPLANT
GLOVE SURG SYN 7.5  E (GLOVE) ×1
GLOVE SURG SYN 7.5 E (GLOVE) ×2 IMPLANT
GLOVE SURG SYN 7.5 PF PI (GLOVE) ×2 IMPLANT
GOWN STRL REIN XL XLG (GOWN DISPOSABLE) ×3 IMPLANT
GOWN STRL REUS W/ TWL LRG LVL3 (GOWN DISPOSABLE) ×2 IMPLANT
GOWN STRL REUS W/ TWL XL LVL3 (GOWN DISPOSABLE) ×2 IMPLANT
GOWN STRL REUS W/TWL LRG LVL3 (GOWN DISPOSABLE) ×3
GOWN STRL REUS W/TWL XL LVL3 (GOWN DISPOSABLE) ×3
IMMOBILIZER SHOULDER FOAM XLGE (SOFTGOODS) IMPLANT
IV NS IRRIG 3000ML ARTHROMATIC (IV SOLUTION) ×3 IMPLANT
KIT SHOULDER TRACTION (DRAPES) ×3 IMPLANT
MANIFOLD NEPTUNE II (INSTRUMENTS) ×3 IMPLANT
NDL SCORPION MULTI FIRE (NEEDLE) IMPLANT
NEEDLE SCORPION MULTI FIRE (NEEDLE) IMPLANT
NS IRRIG 1000ML POUR BTL (IV SOLUTION) ×1 IMPLANT
PACK ARTHROSCOPY DSU (CUSTOM PROCEDURE TRAY) ×3 IMPLANT
PACK BASIN DAY SURGERY FS (CUSTOM PROCEDURE TRAY) ×3 IMPLANT
PROBE BIPOLAR ATHRO 135MM 90D (MISCELLANEOUS) ×3 IMPLANT
SHEET MEDIUM DRAPE 40X70 STRL (DRAPES) ×3 IMPLANT
SLEEVE SCD COMPRESS KNEE MED (MISCELLANEOUS) ×3 IMPLANT
SLING ARM FOAM STRAP LRG (SOFTGOODS) IMPLANT
SLING ARM IMMOBILIZER LRG (SOFTGOODS) IMPLANT
SLING ARM IMMOBILIZER MED (SOFTGOODS) IMPLANT
SLING ARM MED ADULT FOAM STRAP (SOFTGOODS) IMPLANT
SLING ARM XL FOAM STRAP (SOFTGOODS) ×1 IMPLANT
SUT ETHILON 3 0 PS 1 (SUTURE) ×3 IMPLANT
SUT FIBERWIRE #2 38 T-5 BLUE (SUTURE)
SUT LOOP PASSING HIFI (SUTURE) ×1 IMPLANT
SUT TIGER TAPE 7 IN WHITE (SUTURE) IMPLANT
SUTURE FIBERWR #2 38 T-5 BLUE (SUTURE) IMPLANT
SYR 50ML LL SCALE MARK (SYRINGE) ×1 IMPLANT
TAPE FIBER 2MM 7IN #2 BLUE (SUTURE) IMPLANT
TOWEL GREEN STERILE FF (TOWEL DISPOSABLE) ×3 IMPLANT
TOWEL OR NON WOVEN STRL DISP B (DISPOSABLE) ×2 IMPLANT
TUBE CONNECTING 20X1/4 (TUBING) ×1 IMPLANT
TUBING ARTHRO INFLOW-ONLY STRL (TUBING) ×3 IMPLANT
WATER STERILE IRR 1000ML POUR (IV SOLUTION) ×3 IMPLANT

## 2018-04-07 NOTE — H&P (Signed)
    PREOPERATIVE H&P  Chief Complaint: right shoulder impingement, possible rotator cuff tear  HPI: Jacob Dixon is a 56 y.o. male who presents for surgical treatment of right shoulder impingement, possible rotator cuff tear.  He denies any changes in medical history.  Past Medical History:  Diagnosis Date  . Arthritis    knees, arms  . GERD (gastroesophageal reflux disease)    occasional - no current med.  . Gout   . Shoulder impingement syndrome, right 03/2018   Past Surgical History:  Procedure Laterality Date  . NO PAST SURGERIES     Social History   Socioeconomic History  . Marital status: Married    Spouse name: Not on file  . Number of children: Not on file  . Years of education: Not on file  . Highest education level: Not on file  Occupational History  . Not on file  Social Needs  . Financial resource strain: Not on file  . Food insecurity:    Worry: Not on file    Inability: Not on file  . Transportation needs:    Medical: Not on file    Non-medical: Not on file  Tobacco Use  . Smoking status: Never Smoker  . Smokeless tobacco: Never Used  Substance and Sexual Activity  . Alcohol use: No  . Drug use: No  . Sexual activity: Not on file  Lifestyle  . Physical activity:    Days per week: Not on file    Minutes per session: Not on file  . Stress: Not on file  Relationships  . Social connections:    Talks on phone: Not on file    Gets together: Not on file    Attends religious service: Not on file    Active member of club or organization: Not on file    Attends meetings of clubs or organizations: Not on file    Relationship status: Not on file  Other Topics Concern  . Not on file  Social History Narrative  . Not on file   Family History  Problem Relation Age of Onset  . Cancer Mother    No Known Allergies Prior to Admission medications   Medication Sig Start Date End Date Taking? Authorizing Provider  ibuprofen (ADVIL,MOTRIN) 200 MG  tablet Take 200 mg by mouth every 6 (six) hours as needed.   Yes [provider]     Positive ROS: All other systems have been reviewed and were otherwise negative with the exception of those mentioned in the HPI and as above.  Physical Exam: General: Alert, no acute distress Cardiovascular: No pedal edema Respiratory: No cyanosis, no use of accessory musculature GI: abdomen soft Skin: No lesions in the area of chief complaint Neurologic: Sensation intact distally Psychiatric: Patient is competent for consent with normal mood and affect Lymphatic: no lymphedema  MUSCULOSKELETAL: exam stable  Assessment: right shoulder impingement, possible rotator cuff tear  Plan: Plan for Procedure(s): RIGHT SHOULDER ARTHROSCOPY WITH DEBRIDEMENT, DISTAL CLAVICLE EXCISION, BICEPS TENODESIS, SUBACROMIAL DECOMPRESSION, POSSIBLE ROTATOR CUFF REPAIR  The risks benefits and alternatives were discussed with the patient including but not limited to the risks of nonoperative treatment, versus surgical intervention including infection, bleeding, nerve injury,  blood clots, cardiopulmonary complications, morbidity, mortality, among others, and they were willing to proceed.   Eduard Roux, MD   04/07/2018 12:28 PM

## 2018-04-07 NOTE — Anesthesia Procedure Notes (Signed)
Procedure Name: Intubation Date/Time: 04/07/2018 12:45 PM Performed by: Maryella Shivers, CRNA Pre-anesthesia Checklist: Patient identified, Emergency Drugs available, Suction available and Patient being monitored Patient Re-evaluated:Patient Re-evaluated prior to induction Oxygen Delivery Method: Circle system utilized Preoxygenation: Pre-oxygenation with 100% oxygen Induction Type: IV induction Ventilation: Mask ventilation without difficulty Laryngoscope Size: Mac and 3 Grade View: Grade I Tube type: Oral Tube size: 8.0 mm Number of attempts: 1 Airway Equipment and Method: Stylet and Oral airway Placement Confirmation: ETT inserted through vocal cords under direct vision,  positive ETCO2 and breath sounds checked- equal and bilateral Secured at: 20 cm Tube secured with: Tape Dental Injury: Teeth and Oropharynx as per pre-operative assessment

## 2018-04-07 NOTE — Op Note (Signed)
   Date of Surgery: 04/07/2018  INDICATIONS: The patient is a 57 year old male with right shoulder pain that has failed conservative treatment;  The patient did consent to the procedure after discussion of the risks and benefits.  PREOPERATIVE DIAGNOSIS:  1.  Right shoulder proximal biceps tendinosis with degenerative anterior and posterior and superior labral tear 2.  Right shoulder rotator cuff tendinosis 3.  Right shoulder impingement syndrome 4.  Right AC joint arthrosis  POSTOPERATIVE DIAGNOSIS: Same.  PROCEDURE:  1.  Arthroscopic right shoulder biceps tenodesis 2.  Arthroscopic right shoulder extensive debridement of labrum, rotator cuff, synovitis, labrum 3.  Arthroscopic right shoulder distal clavicle excision 4.  Arthroscopic right subacromial decompression with acromioplasty  SURGEON: N. Eduard Roux, M.D.  ASSIST: Ciro Backer [place], [place]; necessary for the timely completion of procedure and due to complexity of procedure..  ANESTHESIA:  general, regional  IV FLUIDS AND URINE: See anesthesia.  ESTIMATED BLOOD LOSS: minimal mL.  IMPLANTS: conmed 4.0 mm cross fit anchor  COMPLICATIONS: None.  DESCRIPTION OF PROCEDURE: The patient was brought to the operating room and placed supine on the operating table.  The patient had been signed prior to the procedure and this was documented. The patient had the anesthesia placed by the anesthesiologist.  A time-out was performed to confirm that this was the correct patient, site, side and location. The patient did receive antibiotics prior to the incision and was re-dosed during the procedure as needed at indicated intervals.  The patient was then moved into the lateral position with the operative extremity suspended in the fishing pole mechanism. The patient had the operative extremity prepped and draped in the standard surgical fashion.    Incisions were made for shoulder arthroscopy portals.  A diagnostic shoulder arthroscopy was  first performed.  We first evaluated the shoulder joint.  There was evidence of degenerative labral tearing of the anterior superior and posterior labrum.  There is no liftoff of the labrum.  Proximal biceps had severe biceps tenosynovitis and tendinosis.  There was no significant chondromalacia.  There was synovitis which was debrided.  The articular surface of the rotator cuff was intact.  I then performed a arthroscopic biceps tenodesis using a 4.0 mm bio composite anchor which was placed in the bicipital groove superior to the subscapularis tendon.  The biceps stump was then gently debrided.  The labrum was also debrided.  We then repositioned the arthroscope into the subacromial space.  I performed a subacromial decompression with acromioplasty.  I excised the subacromial and subdeltoid bursa.  A distal clavicle excision was then performed taking approximately 5 mm of distal clavicle.  I then debrided the bursal surface of the rotator cuff which did not show any full-thickness tears.  This was mainly degenerative tearing of the rotator cuff.  Excess fluid was then removed from the shoulder joint.  The incisions were closed with interrupted nylon sutures.  Sterile dressings were applied.  The arm was placed in a shoulder sling.  Patient tolerated procedure well had no immediate complications.  POSTOPERATIVE PLAN: follow up 1 week in office  N. Eduard Roux, MD [place] 10:16 AM

## 2018-04-07 NOTE — Progress Notes (Signed)
Assisted Dr. Christella Hartigan with right, ultrasound guided, interscalene  block. Side rails up, monitors on throughout procedure. See vital signs in flow sheet. Tolerated Procedure well.

## 2018-04-07 NOTE — Transfer of Care (Signed)
Immediate Anesthesia Transfer of Care Note  Patient: Jacob Dixon  Procedure(s) Performed: RIGHT SHOULDER ARTHROSCOPY WITH DEBRIDEMENT, DISTAL CLAVICLE EXCISION, BICEPS TENODESIS, SUBACROMIAL DECOMPRESSION (Right Shoulder) BICEPS TENODESIS (Left Shoulder)  Patient Location: PACU  Anesthesia Type:GA combined with regional for post-op pain  Level of Consciousness: sedated  Airway & Oxygen Therapy: Patient Spontanous Breathing and Patient connected to face mask oxygen  Post-op Assessment: Report given to RN and Post -op Vital signs reviewed and stable  Post vital signs: Reviewed and stable  Last Vitals:  Vitals Value Taken Time  BP 114/65 04/07/2018  2:11 PM  Temp    Pulse 76 04/07/2018  2:15 PM  Resp 12 04/07/2018  2:15 PM  SpO2 100 % 04/07/2018  2:15 PM  Vitals shown include unvalidated device data.  Last Pain:  Vitals:   04/07/18 1131  TempSrc: Oral  PainSc: 3       Patients Stated Pain Goal: 1 (78/[id number]/84 [id number])  Complications: No apparent anesthesia complications

## 2018-04-07 NOTE — Anesthesia Procedure Notes (Signed)
Anesthesia Regional Block: Interscalene brachial plexus block   Pre-Anesthetic Checklist: ,, timeout performed, Correct Patient, Correct Site, Correct Laterality, Correct Procedure, Correct Position, site marked, Risks and benefits discussed,  Surgical consent,  Pre-op evaluation,  At surgeon's request and post-op pain management  Laterality: Right  Prep: chloraprep       Needles:  Injection technique: Single-shot  Needle Type: Echogenic Stimulator Needle     Needle Length: 9cm  Needle Gauge: 21     Additional Needles:   Procedures:,,,, ultrasound used (permanent image in chart),,,,  Narrative:  Start time: 04/07/2018 12:10 PM End time: 04/07/2018 12:18 PM Injection made incrementally with aspirations every 5 mL.  Performed by: Personally  Anesthesiologist: Lidia Collum, MD  Additional Notes: Monitors applied. Injection made in 5cc increments. No resistance to injection. Good needle visualization. Patient tolerated procedure well.

## 2018-04-07 NOTE — Anesthesia Postprocedure Evaluation (Signed)
Anesthesia Post Note  Patient: Scientist, research (medical)  Procedure(s) Performed: RIGHT SHOULDER ARTHROSCOPY WITH DEBRIDEMENT, DISTAL CLAVICLE EXCISION, BICEPS TENODESIS, SUBACROMIAL DECOMPRESSION (Right Shoulder) BICEPS TENODESIS (Left Shoulder)     Patient location during evaluation: PACU Anesthesia Type: General Level of consciousness: awake and alert Pain management: pain level controlled Vital Signs Assessment: post-procedure vital signs reviewed and stable Respiratory status: spontaneous breathing, nonlabored ventilation and respiratory function stable Cardiovascular status: blood pressure returned to baseline and stable Postop Assessment: no apparent nausea or vomiting Anesthetic complications: no    Last Vitals:  Vitals:   04/07/18 1445 04/07/18 1500  BP: 124/81 130/86  Pulse: 84 75  Resp: 16 16  Temp:  36.4 C  SpO2: 100% 97%    Last Pain:  Vitals:   04/07/18 1500  TempSrc:   PainSc: 0-No pain                 Lidia Collum

## 2018-04-07 NOTE — Discharge Instructions (Signed)
Post Anesthesia Home Care Instructions  Activity: Get plenty of rest for the remainder of the day. A responsible individual must stay with you for 24 hours following the procedure.  For the next 24 hours, DO NOT: -Drive a car -Operate machinery -Drink alcoholic beverages -Take any medication unless instructed by your physician -Make any legal decisions or sign important papers.  Meals: Start with liquid foods such as gelatin or soup. Progress to regular foods as tolerated. Avoid greasy, spicy, heavy foods. If nausea and/or vomiting occur, drink only clear liquids until the nausea and/or vomiting subsides. Call your physician if vomiting continues.  Special Instructions/Symptoms: Your throat may feel dry or sore from the anesthesia or the breathing tube placed in your throat during surgery. If this causes discomfort, gargle with warm salt water. The discomfort should disappear within 24 hours.  If you had a scopolamine patch placed behind your ear for the management of post- operative nausea and/or vomiting:  1. The medication in the patch is effective for 72 hours, after which it should be removed.  Wrap patch in a tissue and discard in the trash. Wash hands thoroughly with soap and water. 2. You may remove the patch earlier than 72 hours if you experience unpleasant side effects which may include dry mouth, dizziness or visual disturbances. 3. Avoid touching the patch. Wash your hands with soap and water after contact with the patch.      Regional Anesthesia Blocks  1. Numbness or the inability to move the "blocked" extremity may last from 3-48 hours after placement. The length of time depends on the medication injected and your individual response to the medication. If the numbness is not going away after 48 hours, call your surgeon.  2. The extremity that is blocked will need to be protected until the numbness is gone and the  Strength has returned. Because you cannot feel it, you  will need to take extra care to avoid injury. Because it may be weak, you may have difficulty moving it or using it. You may not know what position it is in without looking at it while the block is in effect.  3. For blocks in the legs and feet, returning to weight bearing and walking needs to be done carefully. You will need to wait until the numbness is entirely gone and the strength has returned. You should be able to move your leg and foot normally before you try and bear weight or walk. You will need someone to be with you when you first try to ensure you do not fall and possibly risk injury.  4. Bruising and tenderness at the needle site are common side effects and will resolve in a few days.  5. Persistent numbness or new problems with movement should be communicated to the surgeon or the [facility] ([phone])/ [facility] ([phone]).    Information for Discharge Teaching: EXPAREL (bupivacaine liposome injectable suspension)   Your surgeon or anesthesiologist gave you EXPAREL(bupivacaine) to help control your pain after surgery.   EXPAREL is a local anesthetic that provides pain relief by numbing the tissue around the surgical site.  EXPAREL is designed to release pain medication over time and can control pain for up to 72 hours.  Depending on how you respond to EXPAREL, you may require less pain medication during your recovery.  Possible side effects:  Temporary loss of sensation or ability to move in the area where bupivacaine was injected.  Nausea, vomiting, constipation    Rarely, numbness and tingling in your mouth or lips, lightheadedness, or anxiety may occur.  Call your doctor right away if you think you may be experiencing any of these sensations, or if you have other questions regarding possible side effects.  Follow all other discharge instructions given to you by your surgeon or nurse. Eat a healthy diet and drink plenty of water or  other fluids.  If you return to the hospital for any reason within 96 hours following the administration of EXPAREL, it is important for health care providers to know that you have received this anesthetic. A teal colored band has been placed on your arm with the date, time and amount of EXPAREL you have received in order to alert and inform your health care providers. Please leave this armband in place for the full 96 hours following administration, and then you may remove the band.      Post-operative patient instructions  Shoulder Arthroscopy   . Ice:  Place intermittent ice or cooler pack over your shoulder, 30 minutes on and 30 minutes off.  Continue this for the first 72 hours after surgery, then save ice for use after therapy sessions or on more active days.   . Weight:  You may bear weight on your arm as your symptoms allow. . Motion:  Perform gentle shoulder motion as tolerated . Dressing:  Perform 1st dressing change at 2 days postoperative. A moderate amount of blood tinged drainage is to be expected.  So if you bleed through the dressing on the first or second day or if you have fevers, it is fine to change the dressing/check the wounds early and redress wound.  If it bleeds through again, or if the incisions are leaking frank blood, please call the office. May change dressing every 1-2 days thereafter to help watch wounds. Can purchase Tegaderm (or 3M Nexcare) water resistant dressings at local pharmacy / [facility]. . Shower:  Light shower is ok after 2 days.  Please take shower, NO bath. Recover with gauze and ace wrap to help keep wounds protected.   . Pain medication:  A narcotic pain medication has been prescribed.  Take as directed.  Typically you need narcotic pain medication more regularly during the first 3 to 5 days after surgery.  Decrease your use of the medication as the pain improves.  Narcotics can sometimes cause constipation, even after a few doses.  If you have problems  with constipation, you can take an over the counter stool softener or light laxative.  If you have persistent problems, please notify your physician's office. . Physical therapy: Additional activity guidelines to be provided by your physician or physical therapist at follow-up visits.  . Driving: Do not recommend driving x 2 weeks post surgical, especially if surgery performed on right side. Should not drive while taking narcotic pain medications. It typically takes at least 2 weeks to restore sufficient neuromuscular function for normal reaction times for driving safety.  . Call [phone] for questions or problems. Evenings you will be forwarded to the hospital operator.  Ask for the orthopaedic physician on call. Please call if you experience:    o Redness, foul smelling, or persistent drainage from the surgical site  o worsening shoulder pain and swelling not responsive to medication  o any calf pain and or swelling of the lower leg  o temperatures greater than 101.5 F o other questions or concerns   Thank you for allowing us to be a part of your

## 2018-04-07 NOTE — Anesthesia Preprocedure Evaluation (Addendum)
Anesthesia Evaluation  Patient identified by MRN, date of birth, ID band  Reviewed: Allergy & Precautions, NPO status , Patient's Chart, lab work & pertinent test results  History of Anesthesia Complications Negative for: history of anesthetic complications  Airway Mallampati: II  TM Distance: >3 FB Neck ROM: Full    Dental no notable dental hx.    Pulmonary neg pulmonary ROS,    Pulmonary exam normal        Cardiovascular negative cardio ROS Normal cardiovascular exam     Neuro/Psych negative neurological ROS  negative psych ROS   GI/Hepatic Neg liver ROS, GERD  ,  Endo/Other  negative endocrine ROS  Renal/GU negative Renal ROS  negative genitourinary   Musculoskeletal  (+) Arthritis ,   Abdominal   Peds  Hematology negative hematology ROS (+)   Anesthesia Other Findings   Reproductive/Obstetrics                            Anesthesia Physical Anesthesia Plan  ASA: II  Anesthesia Plan: General   Post-op Pain Management: GA combined w/ Regional for post-op pain   Induction: Intravenous  PONV Risk Score and Plan: 2  Airway Management Planned: Oral ETT  Additional Equipment: None  Intra-op Plan:   Post-operative Plan: Extubation in OR  Informed Consent: I have reviewed the patients History and Physical, chart, labs and discussed the procedure including the risks, benefits and alternatives for the proposed anesthesia with the patient or authorized representative who has indicated his/her understanding and acceptance.     Plan Discussed with:   Anesthesia Plan Comments:        Anesthesia Quick Evaluation

## 2018-04-08 ENCOUNTER — Telehealth (INDEPENDENT_AMBULATORY_CARE_PROVIDER_SITE_OTHER): Payer: Self-pay

## 2018-04-08 NOTE — Telephone Encounter (Signed)
Jacob Dixon with [facility] on [address] called wanting to know if Rx for Percocet could be changed to 1 tablet every 4hrs., instead of 1-2 tablets every 6hrs. Advised Dr. Erlinda Hong of message above and he sated that Rx can be changed.  Jacob Dixon with [facility].

## 2018-04-09 ENCOUNTER — Encounter (HOSPITAL_BASED_OUTPATIENT_CLINIC_OR_DEPARTMENT_OTHER): Payer: Self-pay | Admitting: Orthopaedic Surgery

## 2018-04-15 ENCOUNTER — Ambulatory Visit (INDEPENDENT_AMBULATORY_CARE_PROVIDER_SITE_OTHER): Payer: 59 | Admitting: Orthopaedic Surgery

## 2018-04-15 DIAGNOSIS — M24111 Other articular cartilage disorders, right shoulder: Secondary | ICD-10-CM

## 2018-04-15 DIAGNOSIS — M75111 Incomplete rotator cuff tear or rupture of right shoulder, not specified as traumatic: Secondary | ICD-10-CM

## 2018-04-15 DIAGNOSIS — M67921 Unspecified disorder of synovium and tendon, right upper arm: Secondary | ICD-10-CM

## 2018-04-15 NOTE — Progress Notes (Signed)
   Post-Op Visit Note   Patient: Jacob Dixon           Date of Birth: Aug 16, 1960           MRN: [id number] Visit Date: 04/15/2018 PCP: Horald Pollen, MD   Assessment & Plan:  Chief Complaint:  Chief Complaint  Patient presents with  . Right Shoulder - Routine Post Op   Visit Diagnoses:  1. Nontraumatic incomplete tear of right rotator cuff   2. Biceps tendinopathy of right upper extremity   3. Degenerative tear of glenoid labrum of right shoulder     Plan: Mr. Radigan is 1 week status post left shoulder arthroscopy with biceps tenodesis, subacromial decompression, extensive debridement, distal clavicle excision and rotator cuff debridement.  He is doing well overall.  His incisions are healed without any signs of infection.  He is able to raise his arm to the level of his shoulder.  Today we remove the sutures and placed Band-Aids on the incisions.  He may shower normally.  We have given him a referral to outpatient physical therapy.  I reviewed the arthroscopy pictures with the patient today.  I will see him back in 5 weeks for recheck.  Follow-Up Instructions: Return in about 5 weeks (around 05/20/2018).   Orders:  No orders of the defined types were placed in this encounter.  No orders of the defined types were placed in this encounter.   Imaging: No results found.  PMFS History: Patient Active Problem List   Diagnosis Date Noted  . Degenerative tear of glenoid labrum of right shoulder 04/07/2018  . Biceps tendinopathy of right upper extremity 04/07/2018  . Nontraumatic incomplete tear of right rotator cuff 03/25/2018  . Bilateral hip pain 02/18/2018  . Urinary tract infection due to extended-spectrum beta lactamase (ESBL) producing Escherichia coli 10/18/2017  . Lower urinary tract symptoms (LUTS) 10/18/2017  . Dysuria 10/08/2017  . Lower abdominal pain 10/08/2017  . Rectal pain 10/08/2017  . Chronic pain of both shoulders 11/23/2015   Past Medical  History:  Diagnosis Date  . Arthritis    knees, arms  . GERD (gastroesophageal reflux disease)    occasional - no current med.  . Gout   . Shoulder impingement syndrome, right 03/2018    Family History  Problem Relation Age of Onset  . Cancer Mother     Past Surgical History:  Procedure Laterality Date  . BICEPT TENODESIS Left 04/07/2018   Procedure: BICEPS TENODESIS;  Surgeon: Leandrew Koyanagi, MD;  Location: [facility];  Service: Orthopedics;  Laterality: Left;  . NO PAST SURGERIES     Social History   Occupational History  . Not on file  Tobacco Use  . Smoking status: Never Smoker  . Smokeless tobacco: Never Used  Substance and Sexual Activity  . Alcohol use: No  . Drug use: No  . Sexual activity: Not on file

## 2018-05-06 DIAGNOSIS — M25611 Stiffness of right shoulder, not elsewhere classified: Secondary | ICD-10-CM | POA: Diagnosis not present

## 2018-05-06 DIAGNOSIS — M25311 Other instability, right shoulder: Secondary | ICD-10-CM | POA: Diagnosis not present

## 2018-05-06 DIAGNOSIS — M25511 Pain in right shoulder: Secondary | ICD-10-CM | POA: Diagnosis not present

## 2018-05-12 DIAGNOSIS — M25611 Stiffness of right shoulder, not elsewhere classified: Secondary | ICD-10-CM | POA: Diagnosis not present

## 2018-05-12 DIAGNOSIS — M25311 Other instability, right shoulder: Secondary | ICD-10-CM | POA: Diagnosis not present

## 2018-05-12 DIAGNOSIS — M25511 Pain in right shoulder: Secondary | ICD-10-CM | POA: Diagnosis not present

## 2018-05-20 ENCOUNTER — Ambulatory Visit (INDEPENDENT_AMBULATORY_CARE_PROVIDER_SITE_OTHER): Payer: 59 | Admitting: Orthopaedic Surgery

## 2018-05-23 ENCOUNTER — Encounter: Payer: Self-pay | Admitting: Family Medicine

## 2018-05-27 ENCOUNTER — Ambulatory Visit: Payer: 59 | Admitting: Family Medicine

## 2018-05-28 ENCOUNTER — Ambulatory Visit (INDEPENDENT_AMBULATORY_CARE_PROVIDER_SITE_OTHER): Payer: 59 | Admitting: Physician Assistant

## 2018-05-28 ENCOUNTER — Encounter (INDEPENDENT_AMBULATORY_CARE_PROVIDER_SITE_OTHER): Payer: Self-pay | Admitting: Orthopaedic Surgery

## 2018-05-28 VITALS — Ht 60.0 in | Wt 148.2 lb

## 2018-05-28 DIAGNOSIS — Z9889 Other specified postprocedural states: Secondary | ICD-10-CM

## 2018-05-28 MED ORDER — HYDROCODONE-ACETAMINOPHEN 5-325 MG PO TABS: 1.0000 | ORAL_TABLET | Freq: Two times a day (BID) | ORAL | 0 refills | Status: DC | PRN

## 2018-05-28 NOTE — Progress Notes (Signed)
Post-Op Visit Note   Patient: Jacob Dixon           Date of Birth: 01-06-1961           MRN: [id number] Visit Date: 05/28/2018 PCP: Horald Pollen, MD   Assessment & Plan:  Chief Complaint:  Chief Complaint  Patient presents with  . Right Shoulder - Routine Post Op   Visit Diagnoses:  1. S/P arthroscopy of right shoulder     Plan: Patient is a pleasant 58 year old gentleman who presents our clinic today 51 days status post right shoulder arthroscopic decompression and biceps tenodesis, date of surgery 04/07/2018.  He has been doing fairly well.  He did have increased pain yesterday without any known injury or change in activity.  He has been attending formal physical therapy once a week as well as working on a home exercise program.  He does note that he had a fever and felt dizzy last night.  He has been taking Tylenol for the pain.  Examination of his right shoulder reveals well-healed surgical portals without evidence of infection or cellulitis.  He can forward flex to about 160 degrees.  He can internally rotate to his back pocket.  He is neurovascularly intact distally.  At this point, I will have him continue with formal physical therapy as well as his home exercise program.  Limit his lifting to no more than 10 pounds.  I have discussed with him that the fever and dizziness are very likely unrelated to his shoulder.  He will follow-up with [place] in 6 weeks time for recheck.  Follow-Up Instructions: Return in about 6 weeks (around 07/09/2018).   Orders:  No orders of the defined types were placed in this encounter.  Meds ordered this encounter  Medications  . HYDROcodone-acetaminophen (NORCO) 5-325 MG tablet    Sig: Take 1 tablet by mouth 2 (two) times daily as needed for moderate pain.    Dispense:  14 tablet    Refill:  0    Imaging: No new imaging  PMFS History: Patient Active Problem List   Diagnosis Date Noted  . S/P arthroscopy of right shoulder  05/28/2018  . Degenerative tear of glenoid labrum of right shoulder 04/07/2018  . Biceps tendinopathy of right upper extremity 04/07/2018  . Nontraumatic incomplete tear of right rotator cuff 03/25/2018  . Bilateral hip pain 02/18/2018  . Lower urinary tract symptoms (LUTS) 10/18/2017  . Dysuria 10/08/2017  . Lower abdominal pain 10/08/2017  . Rectal pain 10/08/2017  . Chronic pain of both shoulders 11/23/2015   Past Medical History:  Diagnosis Date  . Arthritis    knees, arms  . Asthma   . Diverticulosis of colon   . Dyslipidemia   . GERD without esophagitis    occasional - no current med.  . Gout   . Neuromuscular junction disorder ([place])   . Shoulder impingement syndrome, right 03/2018  . Umbilical hernia     Family History  Problem Relation Age of Onset  . Cancer Mother     Past Surgical History:  Procedure Laterality Date  . BICEPT TENODESIS Left 04/07/2018   Procedure: BICEPS TENODESIS;  Surgeon: Leandrew Koyanagi, MD;  Location: [facility];  Service: Orthopedics;  Laterality: Left;   Social History   Occupational History  . Not on file  Tobacco Use  . Smoking status: Never Smoker  . Smokeless tobacco: Never Used  Substance and Sexual Activity  . Alcohol use: No  . Drug  use: No  . Sexual activity: Not on file

## 2018-05-30 ENCOUNTER — Ambulatory Visit: Payer: 59 | Admitting: Family Medicine

## 2018-06-11 ENCOUNTER — Ambulatory Visit (INDEPENDENT_AMBULATORY_CARE_PROVIDER_SITE_OTHER): Payer: 59 | Admitting: Orthopaedic Surgery

## 2018-06-11 ENCOUNTER — Encounter (INDEPENDENT_AMBULATORY_CARE_PROVIDER_SITE_OTHER): Payer: Self-pay | Admitting: Orthopaedic Surgery

## 2018-06-11 DIAGNOSIS — M25512 Pain in left shoulder: Secondary | ICD-10-CM

## 2018-06-11 DIAGNOSIS — M1612 Unilateral primary osteoarthritis, left hip: Secondary | ICD-10-CM

## 2018-06-11 DIAGNOSIS — M25562 Pain in left knee: Secondary | ICD-10-CM

## 2018-06-11 DIAGNOSIS — M1611 Unilateral primary osteoarthritis, right hip: Secondary | ICD-10-CM

## 2018-06-11 DIAGNOSIS — M25561 Pain in right knee: Secondary | ICD-10-CM | POA: Diagnosis not present

## 2018-06-11 DIAGNOSIS — G8929 Other chronic pain: Secondary | ICD-10-CM | POA: Diagnosis not present

## 2018-06-11 DIAGNOSIS — Z9889 Other specified postprocedural states: Secondary | ICD-10-CM

## 2018-06-11 DIAGNOSIS — M160 Bilateral primary osteoarthritis of hip: Secondary | ICD-10-CM | POA: Insufficient documentation

## 2018-06-11 MED ORDER — METHYLPREDNISOLONE ACETATE 40 MG/ML IJ SUSP: 40.0000 mg | Freq: Once | INTRAMUSCULAR | Status: DC

## 2018-06-11 NOTE — Progress Notes (Signed)
Office Visit Note   Patient: Jacob Dixon           Date of Birth: Dec 22, 1960           MRN: [id number] Visit Date: 06/11/2018              Requested by: Horald Pollen, MD [place], [place] [address] PCP: Horald Pollen, MD   Assessment & Plan: Visit Diagnoses:  1. S/P arthroscopy of right shoulder   2. Chronic left shoulder pain   3. Bilateral primary osteoarthritis of hip   4. Chronic pain of both knees     Plan: Impression is status post right shoulder arthroscopic debridement and biceps tenodesis, #2 left shoulder impingement syndrome #3 mild bilateral hip osteoarthritis with referred pain to both knees.  In regards to the right shoulder, he will continue with formal physical therapy.  No lifting greater than 10 pounds.  Follow-up with [place] in 3 weeks when he is 12 weeks out from surgery.  In regards to the left shoulder, we will obtain an MRI to further assess internal structures.  He will follow-up with [place] once that has been completed.  In regards to both hips, we will refer him back to Dr. Junius Roads for repeat cortisone injections.  In the meantime, we will obtain an autoimmune panel and uric acid level.  This was all discussed with a Spanish interpreter.  Follow-Up Instructions: Return in about 3 weeks (around 07/02/2018).   Orders:  Orders Placed This Encounter  Procedures  . MR Shoulder Left w/o contrast  . Uric acid  . Rheumatoid Factor  . Sed Rate (ESR)  . Antinuclear Antib (ANA)   No orders of the defined types were placed in this encounter.     Procedures: No procedures performed   Clinical Data: No additional findings.   Subjective: Chief Complaint  Patient presents with  . Right Shoulder - Follow-up, Routine Post Op  . Left Shoulder - Pain    HPI patient is a 58 year old Spanish-speaking gentleman who comes in today with an interpreter.  He is 57 days status post right shoulder arthroscopic debridement and biceps tenodesis,  date of surgery 04/07/2018.  He has been in formal physical therapy.  He still admits to 2 out of 10 pain.  His other complaint is pain to multiple joints to include both shoulders, both elbows, both knees and both hips.  History of left shoulder pain which significantly improved following cortisone injection to the subacromial space.  No subsequent MRI.  He also has a history of mild osteoarthritis to both hips.  He has had both hips injected with cortisone which did give him significant relief of symptoms until recently.  No new injury or change in activity.  No history of autoimmune disease or gout.  Review of Systems as detailed in HPI.  All others reviewed and are negative.   Objective: Vital Signs: There were no vitals taken for this visit.  Physical Exam well-developed well-nourished gentleman in no acute distress.  Alert and oriented x3.  Ortho Exam examination of his right shoulder reveals near full active range of motion all planes.  He can internally rotate to L5.  He is neurovascularly intact distally.  Left shoulder exam shows near full range of motion.  Markedly positive empty can.  He is neurovascularly intact distally.  Examination of both hips shows a positive logroll.  Negative straight leg raise.  Stable exam of both knees.  Specialty Comments:  No specialty  comments available.  Imaging: No new imaging   PMFS History: Patient Active Problem List   Diagnosis Date Noted  . Chronic left shoulder pain 06/11/2018  . Bilateral primary osteoarthritis of hip 06/11/2018  . Chronic pain of both knees 06/11/2018  . S/P arthroscopy of right shoulder 05/28/2018  . Degenerative tear of glenoid labrum of right shoulder 04/07/2018  . Biceps tendinopathy of right upper extremity 04/07/2018  . Nontraumatic incomplete tear of right rotator cuff 03/25/2018  . Bilateral hip pain 02/18/2018  . Lower urinary tract symptoms (LUTS) 10/18/2017  . Dysuria 10/08/2017  . Lower abdominal pain  10/08/2017  . Rectal pain 10/08/2017  . Chronic pain of both shoulders 11/23/2015   Past Medical History:  Diagnosis Date  . Arthritis    knees, arms  . Asthma   . Diverticulosis of colon   . Dyslipidemia   . GERD without esophagitis    occasional - no current med.  . Gout   . Neuromuscular junction disorder ([place])   . Shoulder impingement syndrome, right 03/2018  . Umbilical hernia     Family History  Problem Relation Age of Onset  . Cancer Mother     Past Surgical History:  Procedure Laterality Date  . BICEPT TENODESIS Left 04/07/2018   Procedure: BICEPS TENODESIS;  Surgeon: Leandrew Koyanagi, MD;  Location: [facility];  Service: Orthopedics;  Laterality: Left;   Social History   Occupational History  . Not on file  Tobacco Use  . Smoking status: Never Smoker  . Smokeless tobacco: Never Used  Substance and Sexual Activity  . Alcohol use: No  . Drug use: No  . Sexual activity: Not on file

## 2018-06-11 NOTE — Progress Notes (Signed)
Subjective: He is here for bilateral ultrasound-guided hip injections.  Previous injections gave him about a month of relief.  Objective: Bilateral pain with passive internal rotation.  Procedure: Bilateral hip injections: After sterile prep with Betadine, injected 8 cc 1% lidocaine without epinephrine and 40 mg methylprednisolone using ultrasound to guide needle placement passing the 22-gauge spinal needle through the iliofemoral ligament into the femoral head/neck junction of each hip.  Injectate was seen filling the joint capsule on both sides.  He had good pain relief during the immediate anesthetic phase.  Follow-up as directed.

## 2018-06-12 LAB — ANA: Anti Nuclear Antibody(ANA): NEGATIVE

## 2018-06-12 LAB — URIC ACID: Uric Acid, Serum: 6.9 mg/dL (ref 4.0–8.0)

## 2018-06-12 LAB — RHEUMATOID FACTOR: Rheumatoid fact SerPl-aCnc: <14 IU/mL (ref <14)

## 2018-06-12 LAB — SEDIMENTATION RATE: Sed Rate: 17 mm/h (ref 0–20)

## 2018-06-20 ENCOUNTER — Telehealth (INDEPENDENT_AMBULATORY_CARE_PROVIDER_SITE_OTHER): Payer: Self-pay | Admitting: Orthopaedic Surgery

## 2018-06-20 NOTE — Telephone Encounter (Signed)
Pt came into the office today and asked about the blood work he had taken out on his last appt 06-11-2018 pt said he hasn't received a call in regards to the results.  [phone]

## 2018-06-20 NOTE — Telephone Encounter (Signed)
Please advise 

## 2018-06-23 NOTE — Telephone Encounter (Signed)
Are you aware of any lab work?

## 2018-06-23 NOTE — Telephone Encounter (Signed)
Yes, I just looked at it in his chart.   It was autoimmune panel and uric acid.  Kathlee Nations, call you call him and let him know that all of the lab work is normal?

## 2018-06-24 ENCOUNTER — Ambulatory Visit: Payer: 59 | Admitting: Family Medicine

## 2018-06-24 ENCOUNTER — Encounter

## 2018-06-24 NOTE — Patient Instructions (Signed)
Applying for hardship settlement [place]  Call [phone]  Please contact our office when you are ready to schedule a physical exam.  Solicitar el acuerdo de [employer] Financial Assistance  Llame al [phone]  Por favor, pngase en contacto con nuestra oficina cuando est listo para programar un examen fsico.

## 2018-06-24 NOTE — Progress Notes (Deleted)
Jacob Dixon, is a 58 y.o. male  JHE:[id number]  JEH:[id number]  DOB - 12-Jan-1961  CC:  Chief Complaint  Patient presents with  . Establish Care    need financial assistance paperwork to cover his surgery       HPI: Jacob Dixon is a 58 y.o. male is here today to establish care.       Current medications: Current Outpatient Medications:  Marland Kitchen  Multiple Vitamins-Minerals (ONE-A-DAY MENS 50+ ADVANTAGE PO), Take 1 tablet by mouth daily., Disp: , Rfl:  .  omeprazole (PRILOSEC) 20 MG capsule, Take 1 capsule by mouth every morning., Disp: , Rfl:    Pertinent family medical history: family history includes Cancer in his mother.   No Known Allergies  Social History   Socioeconomic History  . Marital status: Married    Spouse name: Not on file  . Number of children: Not on file  . Years of education: Not on file  . Highest education level: Not on file  Occupational History  . Not on file  Social Needs  . Financial resource strain: Not on file  . Food insecurity:    Worry: Not on file    Inability: Not on file  . Transportation needs:    Medical: Not on file    Non-medical: Not on file  Tobacco Use  . Smoking status: Never Smoker  . Smokeless tobacco: Never Used  Substance and Sexual Activity  . Alcohol use: No  . Drug use: No  . Sexual activity: Not on file  Lifestyle  . Physical activity:    Days per week: Not on file    Minutes per session: Not on file  . Stress: Not on file  Relationships  . Social connections:    Talks on phone: Not on file    Gets together: Not on file    Attends religious service: Not on file    Active member of club or organization: Not on file    Attends meetings of clubs or organizations: Not on file    Relationship status: Not on file  . Intimate partner violence:    Fear of current or ex partner: Not on file    Emotionally abused: Not on file    Physically abused: Not on file    Forced sexual activity: Not on file  Other  Topics Concern  . Not on file  Social History Narrative  . Not on file    Review of Systems: Constitutional: Negative for fever, chills, diaphoresis, activity change, appetite change and fatigue. HENT: Negative for ear pain, nosebleeds, congestion, facial swelling, rhinorrhea, neck pain, neck stiffness and ear discharge.  Eyes: Negative for pain, discharge, redness, itching and visual disturbance. Respiratory: Negative for cough, choking, chest tightness, shortness of breath, wheezing and stridor.  Cardiovascular: Negative for chest pain, palpitations and leg swelling. Gastrointestinal: Negative for abdominal distention. Genitourinary: Negative for dysuria, urgency, frequency, hematuria, flank pain, decreased urine volume, difficulty urinating. Musculoskeletal: Negative for back pain, joint swelling, arthralgia and gait problem. Neurological: Negative for dizziness, tremors, seizures, syncope, facial asymmetry, speech difficulty, weakness, light-headedness, numbness and headaches.  Hematological: Negative for adenopathy. Does not bruise/bleed easily. Psychiatric/Behavioral: Negative for hallucinations, behavioral problems, confusion, dysphoric mood, decreased concentration and agitation.    Objective:  There were no vitals filed for this visit.  BP Readings from Last 3 Encounters:  04/07/18 130/86  02/10/18 125/80  02/05/18 128/83    There were no vitals filed for this visit.    Physical Exam: Constitutional: Patient  appears well-developed and well-nourished. No distress. HENT: Normocephalic, atraumatic, External right and left ear normal. Oropharynx is clear and moist.  Eyes: Conjunctivae and EOM are normal. PERRLA, no scleral icterus. Neck: Normal ROM. Neck supple. No JVD. No tracheal deviation. No thyromegaly. CVS: RRR, S1/S2 +, no murmurs, no gallops, no carotid bruit.  Pulmonary: Effort and breath sounds normal, no stridor, rhonchi, wheezes, rales.  Abdominal: Soft. BS +,  no distension, tenderness, rebound or guarding.  Musculoskeletal: Normal range of motion. No edema and no tenderness.  Neuro: Alert. Normal muscle tone coordination. Normal gait. BUE and BLE strength 5/5. Bilateral hand grips symmetrical. No cranial nerve deficit. Skin: Skin is warm and dry. No rash noted. Not diaphoretic. No erythema. No pallor. Psychiatric: Normal mood and affect. Behavior, judgment, thought content normal.  Lab Results (prior encounters)  Lab Results  Component Value Date   WBC 8.3 02/03/2018   HGB 15.3 02/03/2018   HCT 46.0 02/03/2018   MCV 94.0 02/03/2018   PLT 258 10/12/2017   Lab Results  Component Value Date   CREATININE 0.89 10/12/2017   BUN 12 10/12/2017   NA 136 10/12/2017   K 3.2 (L) 10/12/2017   CL 99 (L) 10/12/2017   CO2 29 10/12/2017    No results found for: HGBA1C  No results found for: CHOL, TRIG, HDL, CHOLHDL, VLDL, LDLCALC      Assessment and plan:  There are no diagnoses linked to this encounter.  No follow-ups on file.   The patient was given clear instructions to go to ER or return to medical center if symptoms don't improve, worsen or new problems develop. The patient verbalized understanding. The patient was advised  to call and obtain lab results if they haven't heard anything from out office within 7-10 business days.  Molli Barrows, FNP Primary Care at [facility] [address], [place] [address] [phone]: [phone]    This note has been created with Dragon speech recognition software and Engineer, materials. Any transcriptional errors are unintentional.

## 2018-06-25 NOTE — Telephone Encounter (Signed)
Called patient to advise. He is aware. Advised him on his upcomming appt.

## 2018-06-26 NOTE — Progress Notes (Signed)
Patient came to office erroneously under the impression he was an appointment with [facility] financial assistance. He is attempting to apply for hardship for a bill he receive from a prior surgery he is unable to pay. Patient provided with appropriate contact information for [facility] Billing office. Patient did not want a medical visit as he doesn't wish to incur any additional bills. No charge for encounter as no services were rendered.

## 2018-06-30 ENCOUNTER — Inpatient Hospital Stay: Admission: RE | Admit: 2018-06-30 | Payer: 59 | Source: Ambulatory Visit

## 2018-07-02 ENCOUNTER — Ambulatory Visit (INDEPENDENT_AMBULATORY_CARE_PROVIDER_SITE_OTHER): Payer: 59 | Admitting: Orthopaedic Surgery

## 2018-07-08 ENCOUNTER — Ambulatory Visit (INDEPENDENT_AMBULATORY_CARE_PROVIDER_SITE_OTHER): Payer: 59 | Admitting: Orthopaedic Surgery

## 2018-07-08 ENCOUNTER — Encounter (INDEPENDENT_AMBULATORY_CARE_PROVIDER_SITE_OTHER): Payer: Self-pay | Admitting: Orthopaedic Surgery

## 2018-07-08 DIAGNOSIS — Z9889 Other specified postprocedural states: Secondary | ICD-10-CM

## 2018-07-08 NOTE — Progress Notes (Signed)
rescheduled

## 2018-07-15 ENCOUNTER — Other Ambulatory Visit: Payer: 59

## 2018-09-08 DIAGNOSIS — K219 Gastro-esophageal reflux disease without esophagitis: Secondary | ICD-10-CM | POA: Diagnosis not present

## 2018-09-08 DIAGNOSIS — R109 Unspecified abdominal pain: Secondary | ICD-10-CM | POA: Diagnosis not present

## 2018-09-08 DIAGNOSIS — Z1211 Encounter for screening for malignant neoplasm of colon: Secondary | ICD-10-CM | POA: Diagnosis not present

## 2018-09-26 ENCOUNTER — Ambulatory Visit (INDEPENDENT_AMBULATORY_CARE_PROVIDER_SITE_OTHER): Payer: 59 | Admitting: Gastroenterology

## 2018-09-26 ENCOUNTER — Encounter: Payer: Self-pay | Admitting: Gastroenterology

## 2018-09-26 ENCOUNTER — Other Ambulatory Visit: Payer: Self-pay

## 2018-09-26 VITALS — Ht 60.0 in | Wt 148.0 lb

## 2018-09-26 DIAGNOSIS — R197 Diarrhea, unspecified: Secondary | ICD-10-CM

## 2018-09-26 DIAGNOSIS — R945 Abnormal results of liver function studies: Secondary | ICD-10-CM | POA: Diagnosis not present

## 2018-09-26 DIAGNOSIS — R194 Change in bowel habit: Secondary | ICD-10-CM | POA: Diagnosis not present

## 2018-09-26 DIAGNOSIS — R7989 Other specified abnormal findings of blood chemistry: Secondary | ICD-10-CM

## 2018-09-26 DIAGNOSIS — R1011 Right upper quadrant pain: Secondary | ICD-10-CM

## 2018-09-26 MED ORDER — SUPREP BOWEL PREP KIT 17.5-3.13-1.6 GM/177ML PO SOLN: 1.0000 | ORAL | 0 refills | Status: DC

## 2018-09-26 MED ORDER — DICYCLOMINE HCL 10 MG PO CAPS: 10.0000 mg | ORAL_CAPSULE | Freq: Three times a day (TID) | ORAL | 3 refills | Status: DC

## 2018-09-26 NOTE — Patient Instructions (Signed)
You have been scheduled for a colonoscopy /EGD. Please follow written instructions given to you at your visit today.  Please pick up your prep supplies at the pharmacy within the next 1-3 days. If you use inhalers (even only as needed), please bring them with you on the day of your procedure. Your physician has requested that you go to www.startemmi.com and enter the access code given to you at your visit today. This web site gives a general overview about your procedure. However, you should still follow specific instructions given to you by our office regarding your preparation for the procedure.  You have been scheduled for an abdominal ultrasound at [facility] (1st floor of hospital) on 10/03/18 at 845am. Please arrive 15 minutes prior to your appointment for registration. Make certain not to have anything to eat or drink 6 hours prior to your appointment. Should you need to reschedule your appointment, please contact radiology at [phone]. This test typically takes about 30 minutes to perform.   Stop Omeprazole  We have sent the following medications to your pharmacy for you to pick up at your convenience: Dicyclomine 10mg   Thank you for entrusting me with your care and choosing [facility].  Dr Fuller Plan

## 2018-09-26 NOTE — Progress Notes (Addendum)
History of Present Illness: This is a 58 year old male referred by Scot Jun, FNP for the evaluation of RUQ pain, change in bowel habits, diarrhea, elevated LFTs. Pt only speaks Spanish and his daughter-in-law translated the entire visit.  They relate that the patient has had a one-month history of right upper quadrant pain which is fairly constant and exacerbated by meals.  He is also had a significant change in bowel habits from 1-2 bowel movements per day to about 5 soft stools per day.  Recent CBC unremarkable, recent CMP unremarkable except for an ALT of 62.  He denies any recent antibiotic use or travel history.  He was placed on omeprazole for the past 2 weeks with no change in symptoms.  No prior colonoscopy. CT AP in 08/2017 showed mild diverticulosis, moderate stool burden, suspected small hepatic cysts/hemangiomas, simple renal cysts, suspected small renal cysts, small umbilical hernia containing fat. Denies weight loss, constipation, change in stool caliber, melena, hematochezia, nausea, vomiting, dysphagia, reflux symptoms, chest pain.     No Known Allergies Outpatient Medications Prior to Visit  Medication Sig Dispense Refill  . omeprazole (PRILOSEC) 20 MG capsule Take 1 capsule by mouth every morning.    Marland Kitchen allopurinol (ZYLOPRIM) 100 MG tablet Take 100 mg by mouth daily.    . celecoxib (CELEBREX) 200 MG capsule Take 200 mg by mouth 2 (two) times daily.    . colchicine 0.6 MG tablet Take 0.6 mg by mouth daily.    . cyclobenzaprine (FLEXERIL) 10 MG tablet Take 10 mg by mouth at bedtime.    . hydrocortisone (PROCTOZONE-HC) 2.5 % rectal cream Place 1 application rectally as needed for hemorrhoids or anal itching.    . methocarbamol (ROBAXIN) 750 MG tablet Take 750 mg by mouth 4 (four) times daily.    . Multiple Vitamins-Minerals (ONE-A-DAY MENS 50+ ADVANTAGE PO) Take 1 tablet by mouth daily.    . Omega-3 Fatty Acids (OMEGA 3 PO) Take by mouth daily.    . ondansetron (ZOFRAN)  4 MG tablet Take 4 mg by mouth every 8 (eight) hours as needed for nausea or vomiting.    Marland Kitchen oxyCODONE-acetaminophen (PERCOCET/ROXICET) 5-325 MG tablet Take by mouth every 4 (four) hours as needed for severe pain.    . phenazopyridine (PYRIDIUM) 200 MG tablet Take 200 mg by mouth 3 (three) times daily as needed for pain.    . traMADol (ULTRAM) 50 MG tablet Take by mouth 2 (two) times daily as needed.     No facility-administered medications prior to visit.    Past Medical History:  Diagnosis Date  . Arthritis    knees, arms  . Asthma   . Diverticulosis of colon   . Dyslipidemia   . GERD without esophagitis    occasional - no current med.  . Gout   . Neuromuscular junction disorder ([place])   . Piriformis syndrome   . Shoulder impingement syndrome, right 03/2018  . Umbilical hernia   . Varicocele    Past Surgical History:  Procedure Laterality Date  . BICEPT TENODESIS Left 04/07/2018   Procedure: BICEPS TENODESIS;  Surgeon: Leandrew Koyanagi, MD;  Location: [facility];  Service: Orthopedics;  Laterality: Left;   Social History   Socioeconomic History  . Marital status: Married    Spouse name: Not on file  . Number of children: 3  . Years of education: Not on file  . Highest education level: Not on file  Occupational History  . Not  on file  Social Needs  . Financial resource strain: Not on file  . Food insecurity:    Worry: Not on file    Inability: Not on file  . Transportation needs:    Medical: Not on file    Non-medical: Not on file  Tobacco Use  . Smoking status: Never Smoker  . Smokeless tobacco: Never Used  Substance and Sexual Activity  . Alcohol use: No  . Drug use: No  . Sexual activity: Not on file  Lifestyle  . Physical activity:    Days per week: Not on file    Minutes per session: Not on file  . Stress: Not on file  Relationships  . Social connections:    Talks on phone: Not on file    Gets together: Not on file    Attends religious  service: Not on file    Active member of club or organization: Not on file    Attends meetings of clubs or organizations: Not on file    Relationship status: Not on file  Other Topics Concern  . Not on file  Social History Narrative  . Not on file   Family History  Problem Relation Age of Onset  . Cancer Mother        ? type  . Other Father        old age  . Kidney disease Sister        Review of Systems: Pertinent positive and negative review of systems were noted in the above HPI section. All other review of systems were otherwise negative.    Physical Exam: Telemedicine - not performed   Assessment and Recommendations:  1. RUQ pain, change in bowel habits, diarrhea.  Rule out colorectal neoplasms, ulcer, gastritis, cholelithiasis.  Dicyclomine 10 mg po ac and hs. DC omeprazole. Schedule colonoscopy and EGD. The risks (including bleeding, perforation, infection, missed lesions, medication reactions and possible hospitalization or surgery if complications occur), benefits, and alternatives to colonoscopy with possible biopsy and possible polypectomy were discussed with the patient and they consent to proceed. The risks (including bleeding, perforation, infection, missed lesions, medication reactions and possible hospitalization or surgery if complications occur), benefits, and alternatives to endoscopy with possible biopsy and possible dilation were discussed with the patient and they consent to proceed.    2. Elevated ALT. R/O hepatic steatosis, hepatitis, other disorder. RUQ [place]. Repeat LFTs in 1 month.     These services were provided via telemedicine, audio and video initially.  Due to technical difficulties the last 2 to 3 minutes of the visit were completed by audio only.  The patient was at home with his daughter-in-law who provided all translation and the provider was in the office, alone.  We discussed the limitations of evaluation and management by telemedicine and the  availability of in person appointments.  Patient consented for this telemedicine visit and is aware of possible charges for this service.  An LPN participated in the telemedicine service.  Time spent on call: 17 minutes     cc: Scot Jun, [place], [place] [address]

## 2018-10-03 ENCOUNTER — Other Ambulatory Visit: Payer: Self-pay

## 2018-10-03 ENCOUNTER — Ambulatory Visit (HOSPITAL_COMMUNITY)
Admission: RE | Admit: 2018-10-03 | Discharge: 2018-10-03 | Disposition: A | Discharge status: Home / Self Care | Payer: 59 | Source: Ambulatory Visit | Attending: Gastroenterology | Admitting: Gastroenterology

## 2018-10-03 DIAGNOSIS — R197 Diarrhea, unspecified: Secondary | ICD-10-CM | POA: Insufficient documentation

## 2018-10-03 DIAGNOSIS — R945 Abnormal results of liver function studies: Secondary | ICD-10-CM | POA: Diagnosis present

## 2018-10-03 DIAGNOSIS — R1011 Right upper quadrant pain: Secondary | ICD-10-CM | POA: Insufficient documentation

## 2018-10-03 DIAGNOSIS — R7989 Other specified abnormal findings of blood chemistry: Secondary | ICD-10-CM

## 2018-10-03 DIAGNOSIS — R194 Change in bowel habit: Secondary | ICD-10-CM | POA: Diagnosis present

## 2018-10-29 ENCOUNTER — Telehealth: Payer: Self-pay | Admitting: Gastroenterology

## 2018-10-30 ENCOUNTER — Ambulatory Visit (AMBULATORY_SURGERY_CENTER): Payer: 59 | Admitting: Gastroenterology

## 2018-10-30 ENCOUNTER — Encounter: Payer: Self-pay | Admitting: Gastroenterology

## 2018-10-30 ENCOUNTER — Other Ambulatory Visit: Payer: Self-pay

## 2018-10-30 VITALS — BP 121/80 | HR 73 | Temp 99.6°F | Resp 10 | Ht 60.0 in | Wt 148.0 lb

## 2018-10-30 DIAGNOSIS — D120 Benign neoplasm of cecum: Secondary | ICD-10-CM

## 2018-10-30 DIAGNOSIS — R197 Diarrhea, unspecified: Secondary | ICD-10-CM | POA: Diagnosis not present

## 2018-10-30 DIAGNOSIS — K5730 Diverticulosis of large intestine without perforation or abscess without bleeding: Secondary | ICD-10-CM | POA: Diagnosis not present

## 2018-10-30 DIAGNOSIS — R1011 Right upper quadrant pain: Secondary | ICD-10-CM

## 2018-10-30 DIAGNOSIS — K640 First degree hemorrhoids: Secondary | ICD-10-CM

## 2018-10-30 DIAGNOSIS — R194 Change in bowel habit: Secondary | ICD-10-CM | POA: Diagnosis not present

## 2018-10-30 DIAGNOSIS — B9681 Helicobacter pylori [H. pylori] as the cause of diseases classified elsewhere: Secondary | ICD-10-CM

## 2018-10-30 DIAGNOSIS — K2970 Gastritis, unspecified, without bleeding: Secondary | ICD-10-CM | POA: Diagnosis not present

## 2018-10-30 DIAGNOSIS — K2950 Unspecified chronic gastritis without bleeding: Secondary | ICD-10-CM | POA: Diagnosis not present

## 2018-10-30 MED ORDER — SODIUM CHLORIDE 0.9 % IV SOLN: 500.0000 mL | Freq: Once | INTRAVENOUS | Status: DC

## 2018-10-30 NOTE — Progress Notes (Signed)
A and O x3. Report to RN. Tolerated MAC anesthesia well.Teeth unchanged after procedure.

## 2018-10-30 NOTE — Patient Instructions (Signed)
Thank you for allowing [place] to care for you today.  Continue with your current medications. Resume your current diet.  Return your normal activities tomorrow.  Await pathology results by mail, approximately 2 weeks.     Recommend a high fiber diet.       USTED TUVO UN PROCEDIMIENTO ENDOSCPICO HOY EN EL [facility]:   Lea el informe del procedimiento que se le entreg para cualquier pregunta especfica sobre lo que se Primary school teacher.  Si el informe del examen no responde a sus preguntas, por favor llame a su gastroenterlogo para aclararlo.  Si usted solicit que no se le den [employer] de lo que se Estate manager/land agent en su procedimiento al [employer] va a cuidar, entonces el informe del procedimiento se ha incluido en un sobre sellado para que usted lo revise despus cuando le sea ms conveniente.   LO QUE PUEDE ESPERAR: Algunas sensaciones de hinchazn en el abdomen.  Puede tener ms gases de lo normal.  El caminar puede ayudarle a eliminar el aire que se le puso en el tracto gastrointestinal durante el procedimiento y reducir la hinchazn.  Si le hicieron una endoscopia inferior (como una colonoscopia o una sigmoidoscopia flexible), podra notar manchas de sangre en las heces fecales o en el papel higinico.  Si se someti a una preparacin intestinal para su procedimiento, es posible que no tenga una evacuacin intestinal normal durante [employer].   Tenga en cuenta:  Es posible que note un poco de irritacin y congestin en la nariz o algn drenaje.  Esto es debido al oxgeno [employer] durante su procedimiento.  No hay que preocuparse y esto debe desaparecer ms o Scientist, research (medical).   SNTOMAS PARA REPORTAR INMEDIATAMENTE:  Despus de una endoscopia inferior (colonoscopia o sigmoidoscopia flexible):  Cantidades excesivas de sangre en las heces fecales  Sensibilidad significativa o empeoramiento de los dolores abdominales   Hinchazn aguda del abdomen que antes no tena    Fiebre de 100F o ms   Despus de la endoscopia superior (EGD)  Vmitos de Biochemist, clinical o material como caf molido   Dolor en el pecho o dolor debajo de los omplatos que antes no tena   Dolor o dificultad persistente para tragar  Falta de aire que antes no tena   Fiebre de 100F o ms  Heces fecales negras y pegajosas   Para asuntos urgentes o de Freight forwarder, puede comunicarse con un gastroenterlogo a cualquier hora llamando al [phone].  DIETA:  Recomendamos una comida pequea al principio, pero luego puede continuar con su dieta normal.  Tome muchos lquidos, Teacher, adult education las bebidas alcohlicas durante 24 horas.    ACTIVIDAD:  Debe planear tomarse las cosas con calma por el resto del da y no debe CONDUCIR ni usar maquinaria pesada Programmer, applications (debido a los medicamentos de sedacin utilizados durante el examen).     SEGUIMIENTO: Nuestro personal llamar al nmero que aparece en su historial al siguiente da hbil de su procedimiento para ver cmo se siente y para responder cualquier pregunta o inquietud que pueda tener con respecto a la informacin que se le dio despus del procedimiento. Si no podemos contactarle, le dejaremos un mensaje.  Sin embargo, si se siente bien y no tiene Paediatric nurse, no es necesario que nos devuelva la llamada.  Asumiremos que ha regresado a sus actividades diarias normales sin incidentes. Si se le tomaron algunas biopsias, le contactaremos por telfono o por carta en las prximas  3 semanas.  Si no ha sabido [employer] biopsias en el transcurso de 3 semanas, por favor llmenos al [phone].   FIRMAS/CONFIDENCIALIDAD: Usted y/o el acompaante que le cuide han firmado documentos que se ingresarn en su historial mdico electrnico.  Estas firmas atestiguan el hecho de que la informacin anterior

## 2018-10-30 NOTE — Op Note (Signed)
[place] Patient Name: Jacob Dixon Procedure Date: 10/30/2018 3:16 PM MRN: [id number] Endoscopist: Ladene Artist , MD Age: 58 Referring MD:  Date of Birth: Nov 18, 1960 Gender: Male Account #: [id number] Procedure:                Colonoscopy Indications:              Clinically significant diarrhea of unexplained                            origin, Change in bowel habits Medicines:                Monitored Anesthesia Care Procedure:                Pre-Anesthesia Assessment:                           - Prior to the procedure, a History and Physical                            was performed, and patient medications and                            allergies were reviewed. The patient's tolerance of                            previous anesthesia was also reviewed. The risks                            and benefits of the procedure and the sedation                            options and risks were discussed with the patient.                            All questions were answered, and informed consent                            was obtained. Prior Anticoagulants: The patient has                            taken no previous anticoagulant or antiplatelet                            agents. ASA Grade Assessment: II - A patient with                            mild systemic disease. After reviewing the risks                            and benefits, the patient was deemed in                            satisfactory condition to undergo the procedure.  After obtaining informed consent, the colonoscope                            was passed under direct vision. Throughout the                            procedure, the patient's blood pressure, pulse, and                            oxygen saturations were monitored continuously. The                            Colonoscope was introduced through the anus and                            advanced to the the cecum,  identified by                            appendiceal orifice and ileocecal valve. The                            ileocecal valve, appendiceal orifice, and rectum                            were photographed. The quality of the bowel                            preparation was adequate after extensive lavage and                            suctioning. The colonoscopy was performed without                            difficulty. The patient tolerated the procedure                            well. Scope In: 3:21:05 PM Scope Out: 3:37:54 PM Scope Withdrawal Time: 0 hours 13 minutes 36 seconds  Total Procedure Duration: 0 hours 16 minutes 49 seconds  Findings:                 The perianal and digital rectal examinations were                            normal.                           A 6 mm polyp was found in the cecum. The polyp was                            sessile. The polyp was removed with a cold snare.                            Resection and retrieval were complete.  A few small-mouthed diverticula were found in the                            left colon. There was no evidence of diverticular                            bleeding.                           Internal hemorrhoids were found during                            retroflexion. The hemorrhoids were small and Grade                            I (internal hemorrhoids that do not prolapse).                           The exam was otherwise without abnormality on                            direct and retroflexion views. Random biopsies                            obtained throughout the colon. Complications:            No immediate complications. Estimated blood loss:                            None. Estimated Blood Loss:     Estimated blood loss: none. Impression:               - One 6 mm polyp in the cecum, removed with a cold                            snare. Resected and retrieved.                           -  Mild diverticulosis in the left colon.                           - Internal hemorrhoids.                           - The examination was otherwise normal on direct                            and retroflexion views. Recommendation:           - Repeat colonoscopy date to be determined after                            pending pathology results are reviewed, with a more                            extensive bowel prep.                           -  Patient has a contact number available for                            emergencies. The signs and symptoms of potential                            delayed complications were discussed with the                            patient. Return to normal activities tomorrow.                            Written discharge instructions were provided to the                            patient.                           - High fiber diet.                           - Continue present medications.                           - Await pathology results. Ladene Artist, MD 10/30/2018 3:49:33 PM This report has been signed electronically.

## 2018-10-30 NOTE — Progress Notes (Signed)
Called to room to assist during endoscopic procedure.  Patient ID and intended procedure confirmed with present staff. Received instructions for my participation in the procedure from the performing physician.  

## 2018-10-30 NOTE — Op Note (Signed)
[facility] Patient Name: Jacob Dixon Procedure Date: 10/30/2018 3:15 PM MRN: [id number] Endoscopist: Ladene Artist , MD Age: 58 Referring MD:  Date of Birth: [birth year] Gender: Male Account #: [id number] Procedure:                Upper GI endoscopy Indications:              Abdominal pain in the right upper quadrant Medicines:                Monitored Anesthesia Care Procedure:                Pre-Anesthesia Assessment:                           - Prior to the procedure, a History and Physical                            was performed, and patient medications and                            allergies were reviewed. The patient's tolerance of                            previous anesthesia was also reviewed. The risks                            and benefits of the procedure and the sedation                            options and risks were discussed with the patient.                            All questions were answered, and informed consent                            was obtained. Prior Anticoagulants: The patient has                            taken no previous anticoagulant or antiplatelet                            agents. ASA Grade Assessment: II - A patient with                            mild systemic disease. After reviewing the risks                            and benefits, the patient was deemed in                            satisfactory condition to undergo the procedure.                           After obtaining informed consent, the endoscope was  passed under direct vision. Throughout the                            procedure, the patient's blood pressure, pulse, and                            oxygen saturations were monitored continuously. The                            Model GIF-[id number]) scope was introduced                            through the mouth, and advanced to the second part                            of  duodenum. The upper GI endoscopy was                            accomplished without difficulty. The patient                            tolerated the procedure well. Scope In: Scope Out: Findings:                 The examined esophagus was normal.                           Diffuse moderate inflammation characterized by                            congestion (edema), erythema, friability and                            granularity was found in the entire examined                            stomach. Biopsies were taken with a cold forceps                            for histology.                           The exam of the stomach was otherwise normal.                           The duodenal bulb and second portion of the                            duodenum were normal. Complications:            No immediate complications. Estimated Blood Loss:     Estimated blood loss was minimal. Impression:               - Normal esophagus.                           - Gastritis. Biopsied.                           -  Normal duodenal bulb and second portion of the                            duodenum. Recommendation:           - Patient has a contact number available for                            emergencies. The signs and symptoms of potential                            delayed complications were discussed with the                            patient. Return to normal activities tomorrow.                            Written discharge instructions were provided to the                            patient.                           - Resume previous diet.                           - Continue present medications.                           - Await pathology results. Ladene Artist, MD 10/30/2018 3:52:38 PM This report has been signed electronically.

## 2018-11-03 ENCOUNTER — Telehealth: Payer: Self-pay | Admitting: *Deleted

## 2018-11-03 NOTE — Telephone Encounter (Signed)
Message left

## 2018-11-04 ENCOUNTER — Telehealth: Payer: Self-pay

## 2018-11-04 NOTE — Telephone Encounter (Signed)
Left message on 2nd follow up call. 

## 2018-11-13 ENCOUNTER — Other Ambulatory Visit: Payer: Self-pay

## 2018-11-13 MED ORDER — PYLERA 140-125-125 MG PO CAPS: 3.0000 | ORAL_CAPSULE | Freq: Three times a day (TID) | ORAL | 0 refills | Status: DC

## 2018-11-17 ENCOUNTER — Telehealth: Payer: Self-pay | Admitting: Gastroenterology

## 2018-11-17 MED ORDER — METRONIDAZOLE 250 MG PO TABS: 250.0000 mg | ORAL_TABLET | Freq: Four times a day (QID) | ORAL | 0 refills | Status: AC

## 2018-11-17 MED ORDER — DOXYCYCLINE HYCLATE 100 MG PO CAPS: 100.0000 mg | ORAL_CAPSULE | Freq: Two times a day (BID) | ORAL | 0 refills | Status: AC

## 2018-11-17 MED ORDER — OMEPRAZOLE 20 MG PO CPDR: 20.0000 mg | DELAYED_RELEASE_CAPSULE | Freq: Two times a day (BID) | ORAL | 0 refills | Status: DC

## 2018-11-17 MED ORDER — BISMUTH SUBSALICYLATE 262 MG PO CHEW: 524.0000 mg | CHEWABLE_TABLET | Freq: Four times a day (QID) | ORAL | 0 refills | Status: AC

## 2018-11-17 NOTE — Telephone Encounter (Signed)
Prescriptions for Flagyl, doxycycline, pepto-bismol (which is OTC) and omeprazole was sent to [facility] to take x 14 days.

## 2018-11-17 NOTE — Telephone Encounter (Signed)
Pt informed about medications.

## 2018-11-17 NOTE — Telephone Encounter (Signed)
Pt states that pylera costs over $1000 and he cannot afford it. He is requesting something more affordable.

## 2019-01-28 ENCOUNTER — Ambulatory Visit (INDEPENDENT_AMBULATORY_CARE_PROVIDER_SITE_OTHER): Payer: Managed Care, Other (non HMO) | Admitting: Orthopaedic Surgery

## 2019-01-28 ENCOUNTER — Encounter: Payer: Self-pay | Admitting: Orthopaedic Surgery

## 2019-01-28 VITALS — Ht 60.0 in | Wt 148.0 lb

## 2019-01-28 DIAGNOSIS — M25512 Pain in left shoulder: Secondary | ICD-10-CM | POA: Diagnosis not present

## 2019-01-28 DIAGNOSIS — M25552 Pain in left hip: Secondary | ICD-10-CM

## 2019-01-28 DIAGNOSIS — M25551 Pain in right hip: Secondary | ICD-10-CM | POA: Diagnosis not present

## 2019-01-28 MED ORDER — LIDOCAINE HCL 2 % IJ SOLN
2.0000 mL | INTRAMUSCULAR | Status: AC | PRN
  Administered 2019-01-28: 16:00:00 2 mL

## 2019-01-28 MED ORDER — BUPIVACAINE HCL 0.25 % IJ SOLN
2.0000 mL | INTRAMUSCULAR | Status: AC | PRN
  Administered 2019-01-28: 2 mL via INTRA_ARTICULAR

## 2019-01-28 MED ORDER — METHYLPREDNISOLONE ACETATE 40 MG/ML IJ SUSP
40.0000 mg | INTRAMUSCULAR | Status: AC | PRN
  Administered 2019-01-28: 40 mg via INTRA_ARTICULAR

## 2019-01-28 NOTE — Progress Notes (Signed)
Office Visit Note   Patient: Jacob Dixon           Date of Birth: 17-Dec-1960           MRN: [id number] Visit Date: 01/28/2019              Requested by: Scot Jun, [address] [place],  [place] [address] PCP: Scot Jun, FNP   Assessment & Plan: Visit Diagnoses:  1. Acute pain of left shoulder   2. Bilateral hip pain     Plan: Impression is resolving right shoulder pain following surgical intervention.  #2, left shoulder subacromial bursitis.  #3 bilateral hip osteoarthritis.  In regards to the right shoulder this has significantly improved.  I think it will continue to get better with time.  Regards to the left shoulder, we will inject the subacromial space with cortisone today.  In regards to the hips, he does not feel as though these are bad enough for cortisone injection.  He will follow-up with Dr. Junius Roads should his symptoms worsen.  Otherwise, follow-up with [place] as needed.  This was all discussed with a Spanish-speaking interpreter at today's visit.  Follow-Up Instructions: Return if symptoms worsen or fail to improve.   Orders:  Orders Placed This Encounter  Procedures  . Large Joint Inj: L subacromial bursa   No orders of the defined types were placed in this encounter.     Procedures: Large Joint Inj: L subacromial bursa on 01/28/2019 3:31 PM Indications: pain Details: 22 G needle Medications: 2 mL bupivacaine 0.25 %; 2 mL lidocaine 2 %; 40 mg methylPREDNISolone acetate 40 MG/ML Outcome: tolerated well, no immediate complications Patient was prepped and draped in the usual sterile fashion.       Clinical Data: No additional findings.   Subjective: Chief Complaint  Patient presents with  . Right Shoulder - Pain  . Left Shoulder - Pain    HPI patient is a pleasant 58 year old Spanish-speaking gentleman who presents our clinic today with bilateral shoulder and bilateral hip pain.  In regards to the shoulders, he is  status post right shoulder arthroscopic debridement and biceps tenodesis 04/07/2018.  The pain he has here is very minimal and has significantly improved over the past several months.  In regards to the left shoulder.  He has had intermittent pain for the past several months which has worsened over the past 2 weeks.  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  Pain is worse with the extremes of forward flexion and internal rotation.  No numbness, tingling or burning.  In regards to his hip pain.  Both hips are equally as bad.  He was seen in our office in February 2020 for this.  Mild osteoarthritis was noted.  Both hips were injected with cortisone by Dr. Junius Roads which significantly helped until about 3 weeks ago.  The pain returned and is primarily located to the groin area.  No anterior thigh or knee pain.  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  Review of Systems as detailed in HPI.  All others reviewed and are negative.   Objective: Vital Signs: Ht 5' (1.524 m)   Wt 148 lb (67.1 kg)   BMI 28.90 kg/m   Physical Exam well-developed well-nourished gentleman in no acute distress.  Alert and oriented x3.  Ortho Exam examination of  both shoulders reveals full active range of motion in all planes.  Full strength throughout.  Left shoulder has a minimally positive empty can.  Negative pain with cross body adduction.  He is neurovascular intact distally.  Examination of both hips reveals a minimally positive logroll on the right.  Negative on the left.  Negative straight leg raise both sides.  No focal weakness.  Specialty Comments:  No specialty comments available.  Imaging: No new imaging   PMFS History: Patient Active Problem List   Diagnosis Date Noted  . Chronic left shoulder pain 06/11/2018  . Bilateral primary osteoarthritis of hip 06/11/2018  . Chronic pain of both knees 06/11/2018   . S/P arthroscopy of right shoulder 05/28/2018  . Degenerative tear of glenoid labrum of right shoulder 04/07/2018  . Biceps tendinopathy of right upper extremity 04/07/2018  . Nontraumatic incomplete tear of right rotator cuff 03/25/2018  . Bilateral hip pain 02/18/2018  . Lower urinary tract symptoms (LUTS) 10/18/2017  . Dysuria 10/08/2017  . Lower abdominal pain 10/08/2017  . Rectal pain 10/08/2017  . Chronic pain of both shoulders 11/23/2015   Past Medical History:  Diagnosis Date  . Arthritis    knees, arms  . Asthma    pt. denies  . Diverticulosis of colon   . Dyslipidemia   . GERD without esophagitis    occasional - no current med.  . Gout   . Hyperlipidemia   . Neuromuscular junction disorder ([place])   . Piriformis syndrome   . Shoulder impingement syndrome, right 03/2018  . Umbilical hernia   . Varicocele     Family History  Problem Relation Age of Onset  . Cancer Mother        ? type  . Other Father        old age  . Kidney disease Sister   . Colon cancer Neg Hx   . Colon polyps Neg Hx   . Esophageal cancer Neg Hx   . Rectal cancer Neg Hx   . Stomach cancer Neg Hx     Past Surgical History:  Procedure Laterality Date  . BICEPT TENODESIS Left 04/07/2018   Procedure: BICEPS TENODESIS;  Surgeon: Leandrew Koyanagi, MD;  Location: [facility];  Service: Orthopedics;  Laterality: Left;   Social History   Occupational History  . Not on file  Tobacco Use  . Smoking status: Never Smoker  . Smokeless tobacco: Never Used  Substance and Sexual Activity  . Alcohol use: No  . Drug use: No  . Sexual activity: Not on file

## 2019-02-06 ENCOUNTER — Other Ambulatory Visit: Payer: Self-pay

## 2019-02-06 DIAGNOSIS — Z20822 Contact with and (suspected) exposure to covid-19: Secondary | ICD-10-CM

## 2019-02-07 LAB — NOVEL CORONAVIRUS, NAA: SARS-CoV-2, NAA: DETECTED — AB

## 2019-12-02 ENCOUNTER — Emergency Department (HOSPITAL_COMMUNITY)
Admission: EM | Admit: 2019-12-02 | Discharge: 2019-12-02 | Disposition: A | Discharge status: Home / Self Care | Payer: 59 | Attending: Emergency Medicine | Admitting: Emergency Medicine

## 2019-12-02 ENCOUNTER — Other Ambulatory Visit: Payer: Self-pay

## 2019-12-02 ENCOUNTER — Encounter (HOSPITAL_COMMUNITY): Payer: Self-pay | Admitting: Obstetrics and Gynecology

## 2019-12-02 ENCOUNTER — Emergency Department (HOSPITAL_COMMUNITY): Payer: 59

## 2019-12-02 DIAGNOSIS — Y9289 Other specified places as the place of occurrence of the external cause: Secondary | ICD-10-CM | POA: Insufficient documentation

## 2019-12-02 DIAGNOSIS — Y9389 Activity, other specified: Secondary | ICD-10-CM | POA: Insufficient documentation

## 2019-12-02 DIAGNOSIS — J45909 Unspecified asthma, uncomplicated: Secondary | ICD-10-CM | POA: Insufficient documentation

## 2019-12-02 DIAGNOSIS — Y998 Other external cause status: Secondary | ICD-10-CM | POA: Insufficient documentation

## 2019-12-02 DIAGNOSIS — R0781 Pleurodynia: Secondary | ICD-10-CM | POA: Diagnosis present

## 2019-12-02 DIAGNOSIS — S20211A Contusion of right front wall of thorax, initial encounter: Secondary | ICD-10-CM | POA: Diagnosis not present

## 2019-12-02 MED ORDER — NAPROXEN 375 MG PO TABS
375.0000 mg | ORAL_TABLET | Freq: Once | ORAL | Status: AC
  Administered 2019-12-02: 375 mg via ORAL
  Filled 2019-12-02: qty 1

## 2019-12-02 MED ORDER — NAPROXEN 375 MG PO TABS
375.0000 mg | ORAL_TABLET | Freq: Two times a day (BID) | ORAL | 0 refills | Status: DC
Start: 2019-12-02 — End: 2023-03-18
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
## 2020-12-06 ENCOUNTER — Encounter: Payer: Self-pay | Admitting: Orthopaedic Surgery

## 2020-12-06 ENCOUNTER — Ambulatory Visit: Payer: Self-pay

## 2020-12-06 ENCOUNTER — Other Ambulatory Visit: Payer: Self-pay

## 2020-12-06 ENCOUNTER — Ambulatory Visit: Payer: 59 | Admitting: Orthopaedic Surgery

## 2020-12-06 DIAGNOSIS — M25551 Pain in right hip: Secondary | ICD-10-CM | POA: Diagnosis not present

## 2020-12-06 DIAGNOSIS — M25552 Pain in left hip: Secondary | ICD-10-CM | POA: Diagnosis not present

## 2020-12-06 NOTE — Progress Notes (Signed)
Office Visit Note   Patient: Jacob Dixon           Date of Birth: 11-01-1960           MRN: [id number] Visit Date: 12/06/2020              Requested by: Jacob Dixon, Jacob Dixon,  [place] [address] PCP: Jacob Jun, FNP   Assessment & Plan: Visit Diagnoses:  1. Bilateral hip pain     Plan: Impression is recurrent bilateral hip pain due to mild OA.  Prior injection worked really well therefore we repeated these today.  We will see him back as needed.  Interpreter present today.  Follow-Up Instructions: Return if symptoms worsen or fail to improve.   Orders:  Orders Placed This Encounter  Procedures   XR HIP UNILAT W OR W/O PELVIS 2-3 VIEWS LEFT   XR HIP UNILAT W OR W/O PELVIS 2-3 VIEWS RIGHT   US Guided Needle Placement   No orders of the defined types were placed in this encounter.     Procedures: No procedures performed   Clinical Data: No additional findings.   Subjective: Chief Complaint  Patient presents with   Left Hip - Follow-up   Right Hip - Follow-up    [name] returns today for chronic bilateral hip pain.  We saw him about 2 years ago for this and he underwent intra-articular injections in both hips which helped his pain greatly.  He reports stiffness and discomfort worse in the morning which gets better after walking and movement.  Denies any back symptoms.  Interpreter present today.   Review of Systems  Constitutional: Negative.   All other systems reviewed and are negative.   Objective: Vital Signs: There were no vitals taken for this visit.  Physical Exam Vitals and nursing note reviewed.  Constitutional:      Appearance: He is well-developed.  Pulmonary:     Effort: Pulmonary effort is normal.  Abdominal:     Palpations: Abdomen is soft.  Skin:    General: Skin is warm.  Neurological:     Mental Status: He is alert and oriented to person, place, and time.  Psychiatric:         Behavior: Behavior normal.        Thought Content: Thought content normal.        Judgment: Judgment normal.    Ortho Exam Bilateral hip exams show decent range of motion.  Good strength.  No motor or sensory deficits. Specialty Comments:  No specialty comments available.  Imaging: US Guided Needle Placement  Result Date: 12/06/2020 Ultrasound guided injection is preferred based studies that show increased duration, increased effect, greater accuracy, decreased procedural pain, increased response rate, and decreased cost with ultrasound guided versus blind injection.   Verbal informed consent obtained.  Time-out conducted.  Noted no overlying erythema, induration, or other signs of local infection. Ultrasound-guided bilateral hip injection: After sterile prep with Betadine, injected 5 cc 1% licocaine without ep and 40 mg depo-medrol using a 22-gauge spinal needle, passing the needle through the iliofemoral ligament into the femoral head/neck junction.  Injectate seen filling both joint capsules.    XR HIP UNILAT W OR W/O PELVIS 2-3 VIEWS LEFT  Result Date: 12/06/2020 [place].  No significant degenerative changes.  XR HIP UNILAT W OR W/O PELVIS 2-3 VIEWS RIGHT  Result Date: 12/06/2020 [place].  No significant degenerative changes.    PMFS History: Patient  Active Problem List   Diagnosis Date Noted   Chronic left shoulder pain 06/11/2018   Bilateral primary osteoarthritis of hip 06/11/2018   Chronic pain of both knees 06/11/2018   S/P arthroscopy of right shoulder 05/28/2018   Degenerative tear of glenoid labrum of right shoulder 04/07/2018   Biceps tendinopathy of right upper extremity 04/07/2018   Nontraumatic incomplete tear of right rotator cuff 03/25/2018   Bilateral hip pain 02/18/2018   Lower urinary tract symptoms (LUTS) 10/18/2017   Dysuria 10/08/2017   Lower abdominal pain 10/08/2017   Rectal pain 10/08/2017   Chronic pain of both shoulders 11/23/2015   Past Medical  History:  Diagnosis Date   Arthritis    knees, arms   Asthma    pt. denies   Diverticulosis of colon    Dyslipidemia    GERD without esophagitis    occasional - no current med.   Gout    Hyperlipidemia    Neuromuscular junction disorder ([facility])    Piriformis syndrome    Shoulder impingement syndrome, right [id number]   Umbilical hernia    Varicocele     Family History  Problem Relation Age of Onset   Cancer Mother        ? type   Other Father        old age   Kidney disease Sister    Colon cancer Neg Hx    Colon polyps Neg Hx    Esophageal cancer Neg Hx    Rectal cancer Neg Hx    Stomach cancer Neg Hx     Past Surgical History:  Procedure Laterality Date   BICEPT TENODESIS Left 04/07/2018   Procedure: BICEPS TENODESIS;  Surgeon: Leandrew Koyanagi, MD;  Location: [facility];  Service: Orthopedics;  Laterality: Left;   Social History   Occupational History   Not on file  Tobacco Use   Smoking status: Never   Smokeless tobacco: Never  Vaping Use   Vaping Use: Never used  Substance and Sexual Activity   Alcohol use: No   Drug use: No   Sexual activity: Not on file

## 2020-12-06 NOTE — Progress Notes (Signed)
Subjective: Patient is here for ultrasound-guided intra-articular bilateral hip injection.   Prior injections helped a lot.  Objective:  Pain with IR.  Procedure: Ultrasound guided injection is preferred based studies that show increased duration, increased effect, greater accuracy, decreased procedural pain, increased response rate, and decreased cost with ultrasound guided versus blind injection.   Verbal informed consent obtained.  Time-out conducted.  Noted no overlying erythema, induration, or other signs of local infection. Ultrasound-guided bilateral hip injection: After sterile prep with Betadine, injected 5 cc 1% licocaine without ep and 40 mg depo-medrol using a 22-gauge spinal needle, passing the needle through the iliofemoral ligament into the femoral head/neck junction.  Injectate seen filling both joint capsules.

## 2023-03-18 ENCOUNTER — Emergency Department (HOSPITAL_COMMUNITY)
Admission: EM | Admit: 2023-03-18 | Discharge: 2023-03-18 | Disposition: A | Discharge status: Home / Self Care | Payer: PRIVATE HEALTH INSURANCE | Attending: Emergency Medicine | Admitting: Emergency Medicine

## 2023-03-18 ENCOUNTER — Other Ambulatory Visit: Payer: Self-pay

## 2023-03-18 ENCOUNTER — Emergency Department (HOSPITAL_COMMUNITY): Payer: PRIVATE HEALTH INSURANCE

## 2023-03-18 ENCOUNTER — Encounter (HOSPITAL_COMMUNITY): Payer: Self-pay

## 2023-03-18 DIAGNOSIS — E871 Hypo-osmolality and hyponatremia: Secondary | ICD-10-CM | POA: Insufficient documentation

## 2023-03-18 DIAGNOSIS — R1012 Left upper quadrant pain: Secondary | ICD-10-CM | POA: Diagnosis not present

## 2023-03-18 DIAGNOSIS — R1013 Epigastric pain: Secondary | ICD-10-CM | POA: Diagnosis not present

## 2023-03-18 DIAGNOSIS — E876 Hypokalemia: Secondary | ICD-10-CM | POA: Diagnosis not present

## 2023-03-18 DIAGNOSIS — R1011 Right upper quadrant pain: Secondary | ICD-10-CM | POA: Diagnosis present

## 2023-03-18 LAB — CBC
HCT: 44.5 % (ref 39.0–52.0)
Hemoglobin: 16 g/dL (ref 13.0–17.0)
MCH: 31.4 pg (ref 26.0–34.0)
MCHC: 36 g/dL (ref 30.0–36.0)
MCV: 87.3 fL (ref 80.0–100.0)
Platelets: 250 10*3/uL (ref 150–400)
RBC: 5.1 MIL/uL (ref 4.22–5.81)
RDW: 11.4 % — ABNORMAL LOW (ref 11.5–15.5)
WBC: 6.3 10*3/uL (ref 4.0–10.5)
nRBC: 0 % (ref 0.0–0.2)

## 2023-03-18 LAB — COMPREHENSIVE METABOLIC PANEL
ALT: 26 U/L (ref 0–44)
AST: 21 U/L (ref 15–41)
Albumin: 4.2 g/dL (ref 3.5–5.0)
Alkaline Phosphatase: 51 U/L (ref 38–126)
Anion gap: 11 (ref 5–15)
BUN: 10 mg/dL (ref 8–23)
CO2: 25 mmol/L (ref 22–32)
Calcium: 9.3 mg/dL (ref 8.9–10.3)
Chloride: 96 mmol/L — ABNORMAL LOW (ref 98–111)
Creatinine, Ser: 1.23 mg/dL (ref 0.61–1.24)
GFR, Estimated: >60 mL/min (ref >60)
Glucose, Bld: 180 mg/dL — ABNORMAL HIGH (ref 70–99)
Potassium: 3.3 mmol/L — ABNORMAL LOW (ref 3.5–5.1)
Sodium: 132 mmol/L — ABNORMAL LOW (ref 135–145)
Total Bilirubin: 1 mg/dL (ref <1.2)
Total Protein: 7.3 g/dL (ref 6.5–8.1)

## 2023-03-18 LAB — URINALYSIS, ROUTINE W REFLEX MICROSCOPIC
Bacteria, UA: NONE SEEN
Bilirubin Urine: NEGATIVE
Glucose, UA: NEGATIVE mg/dL
Hgb urine dipstick: NEGATIVE
Ketones, ur: NEGATIVE mg/dL
Nitrite: NEGATIVE
Protein, ur: NEGATIVE mg/dL
Specific Gravity, Urine: 1.023 (ref 1.005–1.030)
pH: 8 (ref 5.0–8.0)

## 2023-03-18 LAB — LIPASE, BLOOD: Lipase: 25 U/L (ref 11–51)

## 2023-03-18 MED ORDER — ONDANSETRON HCL 4 MG/2ML IJ SOLN
4.0000 mg | Freq: Once | INTRAMUSCULAR | Status: AC
  Administered 2023-03-18: 4 mg via INTRAVENOUS
  Filled 2023-03-18: qty 2

## 2023-03-18 MED ORDER — POTASSIUM CHLORIDE CRYS ER 20 MEQ PO TBCR
40.0000 meq | EXTENDED_RELEASE_TABLET | Freq: Once | ORAL | Status: AC
  Administered 2023-03-18: 40 meq via ORAL
  Filled 2023-03-18: qty 2

## 2023-03-18 MED ORDER — IOHEXOL 350 MG/ML SOLN
60.0000 mL | Freq: Once | INTRAVENOUS | Status: AC | PRN
  Administered 2023-03-18: 60 mL via INTRAVENOUS

## 2023-03-18 MED ORDER — ONDANSETRON 4 MG PO TBDP: 4.0000 mg | ORAL_TABLET | Freq: Three times a day (TID) | ORAL | 0 refills | Status: AC | PRN

## 2023-03-18 NOTE — ED Notes (Signed)
I  used interpreter to triage pt

## 2023-03-18 NOTE — ED Notes (Signed)
Patient transported to CT 

## 2023-03-18 NOTE — ED Notes (Signed)
Pt unable to urinate at this time.  

## 2023-03-18 NOTE — ED Provider Notes (Signed)
[place] AT [facility] Provider Note   CSN: [id number] Arrival date & time: 03/18/23  2952     History  Chief Complaint  Patient presents with   Abdominal Pain    Jacob Dixon is a 62 y.o. male.  History obtained from the patient via Spanish interpreter. Patient is a 62 year old male who has a PMH of bilateral hip arthritis, prior gastritis who presents to the ED for abdominal pain. Patient reports he has had intermittent bilateral upper quadrant abdominal pain for the past 1 month. Reports it is across his entire upper abdomen and worsens with meals. Endorses previously having nonbloody vomiting and diarrhea about 2 weeks ago that has since resolved. States he has taken pepto-bismol that has been somewhat helpful. Denies constipation, dysuria, hematuria. Reports a history of prior EGD but denies abdominal surgeries.    Abdominal Pain      Home Medications Prior to Admission medications   Medication Sig Start Date End Date Taking? Authorizing Provider  Multiple Vitamin (MULTIVITAMIN PO) Take 1 tablet by mouth daily.   Yes [provider]  ondansetron (ZOFRAN-ODT) 4 MG disintegrating tablet Take 1 tablet (4 mg total) by mouth every 8 (eight) hours as needed for up to 4 days for nausea or vomiting. 03/18/23 03/22/23 Yes Janyth Pupa, MD  pantoprazole (PROTONIX) 40 MG tablet Take 40 mg by mouth 2 (two) times daily.   Yes [provider]  PROCTO-MED HC 2.5 % rectal cream Place 1 Application rectally 2 (two) times daily as needed for hemorrhoids.   Yes [provider]  triamcinolone cream (KENALOG) 0.1 % Apply 1 Application topically 2 (two) times daily as needed (Irritation). 03/14/23  Yes [provider]  atorvastatin (LIPITOR) 10 MG tablet atorvastatin 10 mg tablet  TAKE 1 TABLET BY MOUTH ONCE DAILY AT BEDTIME Patient not taking: Reported on 03/18/2023    [provider]      Allergies     Patient has no known allergies.    Review of Systems   Review of Systems  Gastrointestinal:  Positive for abdominal pain.    Physical Exam Updated Vital Signs BP 117/67   Pulse 85   Temp 98.1 F (36.7 C) (Oral)   Resp 14   Ht 5' (1.524 m)   Wt 67.1 kg   SpO2 95%   BMI 28.89 kg/m  Physical Exam Constitutional:      General: He is not in acute distress.    Appearance: He is well-developed. He is not ill-appearing.  HENT:     Head: Normocephalic and atraumatic.     Mouth/Throat:     Mouth: Mucous membranes are moist.     Pharynx: Oropharynx is clear.  Eyes:     Extraocular Movements: Extraocular movements intact.     Pupils: Pupils are equal, round, and reactive to light.  Cardiovascular:     Rate and Rhythm: Normal rate and regular rhythm.     Heart sounds: Normal heart sounds. No murmur heard.    No friction rub. No gallop.  Pulmonary:     Breath sounds: Normal breath sounds. No stridor. No wheezing, rhonchi or rales.  Abdominal:     Palpations: Abdomen is soft.     Tenderness: There is abdominal tenderness in the right upper quadrant, epigastric area and left upper quadrant. There is no right CVA tenderness, left CVA tenderness, guarding or rebound.  Skin:    General: Skin is warm and dry.  Capillary Refill: Capillary refill takes less than 2 seconds.  Neurological:     General: No focal deficit present.     Mental Status: He is alert.     Comments: Sensation and motor function intact in all 4 extremities   ED Results / Procedures / Treatments   Labs (all labs ordered are listed, but only abnormal results are displayed) Labs Reviewed  COMPREHENSIVE METABOLIC PANEL - Abnormal; Notable for the following components:      Result Value   Sodium 132 (*)    Potassium 3.3 (*)    Chloride 96 (*)    Glucose, Bld 180 (*)    All other components within normal limits  CBC - Abnormal; Notable for the following components:   RDW 11.4 (*)    All other components  within normal limits  URINALYSIS, ROUTINE W REFLEX MICROSCOPIC - Abnormal; Notable for the following components:   Color, Urine STRAW (*)    Leukocytes,Ua TRACE (*)    All other components within normal limits  LIPASE, BLOOD    EKG None  Radiology CT ABDOMEN PELVIS W CONTRAST  Result Date: 03/18/2023 CLINICAL DATA:  Abdominal pain, acute, nonlocalized EXAM: CT ABDOMEN AND PELVIS WITH CONTRAST TECHNIQUE: Multidetector CT imaging of the abdomen and pelvis was performed using the standard protocol following bolus administration of intravenous contrast. RADIATION DOSE REDUCTION: This exam was performed according to the departmental dose-optimization program which includes automated exposure control, adjustment of the mA and/or kV according to patient size and/or use of iterative reconstruction technique. CONTRAST:  60mL OMNIPAQUE IOHEXOL 350 MG/ML SOLN COMPARISON:  CT scan abdomen and pelvis from 10/12/2017. FINDINGS: Lower chest: The lung bases are clear. No pleural effusion. The heart is normal in size. No pericardial effusion. Hepatobiliary: The liver is normal in size. Non-cirrhotic configuration. No suspicious mass. These is mild diffuse hepatic steatosis. Redemonstration of 2, subcentimeter hypoattenuating foci in the liver, too small to adequately characterize but unchanged since the prior study from 2019 and favored benign in etiology. No intrahepatic or extrahepatic bile duct dilation. No calcified gallstones. Normal gallbladder wall thickness. No pericholecystic inflammatory changes. Pancreas: Unremarkable. No pancreatic ductal dilatation or surrounding inflammatory changes. Spleen: Within normal limits. No focal lesion. Adrenals/Urinary Tract: Adrenal glands are unremarkable. No suspicious renal mass. There are multiple cysts in bilateral kidneys with largest in the right kidney interpolar region measuring up to 2.3 x 2.4 cm. There is a partially exophytic hypoattenuating structure arising from  the left kidney upper pole, posteriorly measuring up to 1.2 x 1.3 cm which exhibits internal CT attenuation of 35 Hounsfield units. This is present since the prior study from 2019 however, appears decreased in size. This is favored to represent an involuting hemorrhagic/proteinaceous cyst. No hydronephrosis. No renal or ureteric calculi. Unremarkable urinary bladder. Stomach/Bowel: No disproportionate dilation of the small or large bowel loops. No evidence of abnormal bowel wall thickening or inflammatory changes. The appendix is unremarkable. Vascular/Lymphatic: No ascites or pneumoperitoneum. No abdominal or pelvic lymphadenopathy, by size criteria. No aneurysmal dilation of the major abdominal arteries. Reproductive: Enlarged prostate. Symmetric seminal vesicles. Other: There is a tiny fat containing umbilical hernia. The soft tissues and abdominal wall are otherwise unremarkable. Musculoskeletal: No suspicious osseous lesions. There are mild multilevel degenerative changes in the visualized spine. IMPRESSION: *No acute inflammatory process identified within the abdomen or pelvis. *Multiple other nonacute observations, as described above. Electronically Signed   By: Jules Schick M.D.   On: 03/18/2023 13:53   DG Chest 2  View  Result Date: 03/18/2023 CLINICAL DATA:  LUQ pain. EXAM: CHEST - 2 VIEW COMPARISON:  12/02/2019. FINDINGS: Bilateral lung fields are clear. Bilateral costophrenic angles are clear. Normal cardio-mediastinal silhouette. No acute osseous abnormalities. The soft tissues are within normal limits. No free air under the domes of diaphragm. IMPRESSION: *No active cardiopulmonary disease. Electronically Signed   By: Jules Schick M.D.   On: 03/18/2023 13:45    Procedures Procedures    Medications Ordered in ED Medications  iohexol (OMNIPAQUE) 350 MG/ML injection 60 mL (60 mLs Intravenous Contrast Given 03/18/23 1204)  potassium chloride SA (KLOR-CON M) CR tablet 40 mEq (40 mEq Oral  Given 03/18/23 1502)  ondansetron (ZOFRAN) injection 4 mg (4 mg Intravenous Given 03/18/23 1504)    ED Course/ Medical Decision Making/ A&P                                 Medical Decision Making Amount and/or Complexity of Data Reviewed Labs: ordered. Radiology: ordered.  Risk Prescription drug management.   CMP with mild hyponatremia and hypokalemia, creatinine WNL. CBC with no leukocytosis or anemia. Lipase WNL. UA not concerning for infection. Etiology of the patient's symptoms is likely secondary to gastritis, which he has had before per chart review of EGD findings. I have low suspicion for acute intra-abdominal process including cholecystitis, appendicitis, cystitis/pyelonephritis, or bowel obstruction given CT findings. Patient was administered potassium supplementation and zofran in the ED.   Results of his laboratory and imaging evaluations were discussed with the patient via spanish interpreter. Ambulatory referral to GI was placed for possible repeat endoscopy and the patient was encouraged to follow up with his PCP. I encouraged him to continue using protonix as prescribed and provided a prescription for zofran PRN. Strict return precautions were discussed, and patient voiced understanding. Patient was discharged in stable condition.        Final Clinical Impression(s) / ED Diagnoses Final diagnoses:  Epigastric pain    Rx / DC Orders ED Discharge Orders          Ordered    ondansetron (ZOFRAN-ODT) 4 MG disintegrating tablet  Every 8 hours PRN        03/18/23 1503    Ambulatory referral to Gastroenterology        03/18/23 1506              Janyth Pupa, MD 03/18/23 1645    Blane Ohara, MD 03/19/23 [phone]

## 2023-03-18 NOTE — ED Triage Notes (Signed)
Pt c/o nausea, burning in anus after a BM and LUQ, RUQ, right flank and left flank painx36mo. Pt denies V/D. Pt c/o frequent urination and dysuriax15d. Pt states he's not sexually active.

## 2023-03-18 NOTE — Discharge Instructions (Addendum)
Usted fue atendido aqu hoy por dolor abdominal. Su tomografa computarizada no mostr evidencia de problemas con el estmago, la vescula biliar, los intestinos o el apndice. Haga un seguimiento con su PCP y Architect. Tome zofran segn lo prescrito. Regrese al servicio de urgencias si tiene dolor en el pecho, dificultad para respirar, vmitos intensos o incapacidad para retener los alimentos, prdida del conocimiento o cualquier sntoma o preocupacin que empeore.

## 2023-04-14 ENCOUNTER — Encounter (HOSPITAL_COMMUNITY): Payer: Self-pay

## 2023-04-14 ENCOUNTER — Emergency Department (HOSPITAL_COMMUNITY)
Admission: EM | Admit: 2023-04-14 | Discharge: 2023-04-14 | Disposition: A | Discharge status: Home / Self Care | Payer: PRIVATE HEALTH INSURANCE | Attending: Emergency Medicine | Admitting: Emergency Medicine

## 2023-04-14 ENCOUNTER — Emergency Department (HOSPITAL_COMMUNITY): Payer: PRIVATE HEALTH INSURANCE

## 2023-04-14 ENCOUNTER — Other Ambulatory Visit: Payer: Self-pay

## 2023-04-14 DIAGNOSIS — K219 Gastro-esophageal reflux disease without esophagitis: Secondary | ICD-10-CM | POA: Insufficient documentation

## 2023-04-14 DIAGNOSIS — R109 Unspecified abdominal pain: Secondary | ICD-10-CM | POA: Diagnosis present

## 2023-04-14 DIAGNOSIS — J45909 Unspecified asthma, uncomplicated: Secondary | ICD-10-CM | POA: Diagnosis not present

## 2023-04-14 LAB — URINALYSIS, ROUTINE W REFLEX MICROSCOPIC
Bilirubin Urine: NEGATIVE
Glucose, UA: NEGATIVE mg/dL
Hgb urine dipstick: NEGATIVE
Ketones, ur: NEGATIVE mg/dL
Leukocytes,Ua: NEGATIVE
Nitrite: NEGATIVE
Protein, ur: NEGATIVE mg/dL
Specific Gravity, Urine: 1.009 (ref 1.005–1.030)
pH: 8 (ref 5.0–8.0)

## 2023-04-14 LAB — CBC
HCT: 45.9 % (ref 39.0–52.0)
Hemoglobin: 16.9 g/dL (ref 13.0–17.0)
MCH: 31.5 pg (ref 26.0–34.0)
MCHC: 36.8 g/dL — ABNORMAL HIGH (ref 30.0–36.0)
MCV: 85.5 fL (ref 80.0–100.0)
Platelets: 273 10*3/uL (ref 150–400)
RBC: 5.37 MIL/uL (ref 4.22–5.81)
RDW: 11.4 % — ABNORMAL LOW (ref 11.5–15.5)
WBC: 5.6 10*3/uL (ref 4.0–10.5)
nRBC: 0 % (ref 0.0–0.2)

## 2023-04-14 LAB — COMPREHENSIVE METABOLIC PANEL
ALT: 22 U/L (ref 0–44)
AST: 20 U/L (ref 15–41)
Albumin: 4.2 g/dL (ref 3.5–5.0)
Alkaline Phosphatase: 59 U/L (ref 38–126)
Anion gap: 11 (ref 5–15)
BUN: 7 mg/dL — ABNORMAL LOW (ref 8–23)
CO2: 25 mmol/L (ref 22–32)
Calcium: 9.6 mg/dL (ref 8.9–10.3)
Chloride: 95 mmol/L — ABNORMAL LOW (ref 98–111)
Creatinine, Ser: 0.99 mg/dL (ref 0.61–1.24)
GFR, Estimated: >60 mL/min (ref >60)
Glucose, Bld: 119 mg/dL — ABNORMAL HIGH (ref 70–99)
Potassium: 3.2 mmol/L — ABNORMAL LOW (ref 3.5–5.1)
Sodium: 131 mmol/L — ABNORMAL LOW (ref 135–145)
Total Bilirubin: 1 mg/dL (ref <1.2)
Total Protein: 7.6 g/dL (ref 6.5–8.1)

## 2023-04-14 LAB — TROPONIN I (HIGH SENSITIVITY): Troponin I (High Sensitivity): 4 ng/L (ref <18)

## 2023-04-14 LAB — LIPASE, BLOOD: Lipase: 33 U/L (ref 11–51)

## 2023-04-14 MED ORDER — SODIUM CHLORIDE 0.9 % IV BOLUS
500.0000 mL | Freq: Once | INTRAVENOUS | Status: AC
  Administered 2023-04-14: 500 mL via INTRAVENOUS

## 2023-04-14 MED ORDER — ALUM & MAG HYDROXIDE-SIMETH 200-200-20 MG/5ML PO SUSP
30.0000 mL | Freq: Once | ORAL | Status: AC
  Administered 2023-04-14: 30 mL via ORAL
  Filled 2023-04-14: qty 30

## 2023-04-14 MED ORDER — DICYCLOMINE HCL 20 MG PO TABS: 20.0000 mg | ORAL_TABLET | Freq: Two times a day (BID) | ORAL | 0 refills | Status: DC | PRN

## 2023-04-14 MED ORDER — PANTOPRAZOLE SODIUM 40 MG IV SOLR
40.0000 mg | Freq: Once | INTRAVENOUS | Status: AC
  Administered 2023-04-14: 40 mg via INTRAVENOUS
  Filled 2023-04-14: qty 10

## 2023-04-14 MED ORDER — PANTOPRAZOLE SODIUM 40 MG PO TBEC: 40.0000 mg | DELAYED_RELEASE_TABLET | Freq: Two times a day (BID) | ORAL | 0 refills | Status: DC

## 2023-04-14 MED ORDER — DICYCLOMINE HCL 10 MG PO CAPS
10.0000 mg | ORAL_CAPSULE | Freq: Once | ORAL | Status: AC
  Administered 2023-04-14: 10 mg via ORAL
  Filled 2023-04-14: qty 1

## 2023-04-14 MED ORDER — POTASSIUM CHLORIDE CRYS ER 20 MEQ PO TBCR
40.0000 meq | EXTENDED_RELEASE_TABLET | Freq: Once | ORAL | Status: AC
  Administered 2023-04-14: 40 meq via ORAL
  Filled 2023-04-14: qty 2

## 2023-04-14 MED ORDER — SUCRALFATE 1 G PO TABS: 1.0000 g | ORAL_TABLET | Freq: Three times a day (TID) | ORAL | 0 refills | Status: DC

## 2023-04-14 NOTE — ED Provider Notes (Signed)
[place] EMERGENC[facility] DE[facility]MENT AT [facility] Provider Note   CSN: [id number] Arrival date & time: 04/14/23  1056     History  Chief Complaint  Patient presents with   Abdominal Pain   Chest Pain    Jacob Dixon is a 62 y.o. male.  Patient with noncontributory past medical history presents today with complaints of abdominal pain. He states that same has been ongoing for the past 1 month. Pain is only present after he eats. States that he has been seen previously for this pain and diagnosed with gastritis and placed on protonix. He notes that he has an upper endoscopy scheduled for 12/23 for further evaluation of this pain.  8 days ago he stopped taking the Protonix because he did not feel like it was helping and his pain subsequently worsened.  He states that now he is having pain that radiates up into his chest and into his mouth when he eats and soon afterwards.  States that when he had this happen previously he had to be on antibiotics for a bacterial infection.  He is concerned for same.  He is currently on no acid reflux medication.  He denies fevers, chills, shortness of breath, nausea, vomiting, or diarrhea.  Does note that 1 month ago after he was diagnosed with gastritis he did alter his diet and is no longer consuming spicy foods or fatty foods.  The history is provided by the patient. A language interpreter was used.  Abdominal Pain Chest Pain Associated symptoms: abdominal pain        Home Medications Prior to Admission medications   Medication Sig Start Date End Date Taking? Authorizing Provider  atorvastatin (LIPITOR) 10 MG tablet atorvastatin 10 mg tablet  TAKE 1 TABLET BY MOUTH ONCE DAILY AT BEDTIME Patient not taking: Reported on 03/18/2023    [provider]  Multiple Vitamin (MULTIVITAMIN PO) Take 1 tablet by mouth daily.    [provider]  pantoprazole (PROTONIX) 40 MG tablet Take 40 mg by mouth 2 (two) times daily.     [provider]  PROCTO-MED HC 2.5 % rectal cream Place 1 Application rectally 2 (two) times daily as needed for hemorrhoids.    [provider]  triamcinolone cream (KENALOG) 0.1 % Apply 1 Application topically 2 (two) times daily as needed (Irritation). 03/14/23   [provider]      Allergies    Patient has no known allergies.    Review of Systems   Review of Systems  Gastrointestinal:  Positive for abdominal pain.  All other systems reviewed and are negative.   Physical Exam Updated Vital Signs BP 136/84   Pulse 86   Temp 98 F (36.7 C) (Oral)   Resp 12   SpO2 100%  Physical Exam Vitals and nursing note reviewed.  Constitutional:      General: He is not in acute distress.    Appearance: Normal appearance. He is normal weight. He is not ill-appearing, toxic-appearing or diaphoretic.  HENT:     Head: Normocephalic and atraumatic.  Cardiovascular:     Rate and Rhythm: Normal rate and regular rhythm.     Heart sounds: Normal heart sounds.  Pulmonary:     Effort: Pulmonary effort is normal. No respiratory distress.     Breath sounds: Normal breath sounds.  Abdominal:     General: Abdomen is flat.     Palpations: Abdomen is soft.     Tenderness: There is no abdominal tenderness.  Musculoskeletal:  General: Normal range of motion.     Cervical back: Normal range of motion.  Skin:    General: Skin is warm and dry.  Neurological:     General: No focal deficit present.     Mental Status: He is alert.  Psychiatric:        Mood and Affect: Mood normal.        Behavior: Behavior normal.     ED Results / Procedures / Treatments   Labs (all labs ordered are listed, but only abnormal results are displayed) Labs Reviewed  CBC - Abnormal; Notable for the following components:      Result Value   MCHC 36.8 (*)    RDW 11.4 (*)    All other components within normal limits  COMPREHENSIVE METABOLIC PANEL - Abnormal; Notable for the  following components:   Sodium 131 (*)    Potassium 3.2 (*)    Chloride 95 (*)    Glucose, Bld 119 (*)    BUN 7 (*)    All other components within normal limits  LIPASE, BLOOD  URINALYSIS, ROUTINE W REFLEX MICROSCOPIC  TROPONIN I (HIGH SENSITIVITY)    EKG None  Radiology DG Chest 2 View Result Date: 04/14/2023 CLINICAL DATA:  Chest pain and abdominal pain. EXAM: CHEST - 2 VIEW COMPARISON:  Chest two views 03/18/2023 FINDINGS: Cardiac silhouette and mediastinal contours are within normal limits. The lungs are clear. No pleural effusion or pneumothorax. No acute skeletal abnormality. IMPRESSION: No active cardiopulmonary disease. Electronically Signed   By: Neita Garnet M.D.   On: 04/14/2023 11:44    Procedures Procedures    Medications Ordered in ED Medications  sodium chloride 0.9 % bolus 500 mL (500 mLs Intravenous New Bag/Given 04/14/23 1227)  potassium chloride SA (KLOR-CON M) CR tablet 40 mEq (40 mEq Oral Given 04/14/23 1229)  pantoprazole (PROTONIX) injection 40 mg (40 mg Intravenous Given 04/14/23 1227)  alum & mag hydroxide-simeth (MAALOX/MYLANTA) 200-200-20 MG/5ML suspension 30 mL (30 mLs Oral Given 04/14/23 1231)  dicyclomine (BENTYL) capsule 10 mg (10 mg Oral Given 04/14/23 1244)    ED Course/ Medical Decision Making/ A&P                                 Medical Decision Making Amount and/or Complexity of Data Reviewed Labs: ordered. Radiology: ordered.  Risk OTC drugs. Prescription drug management.   This patient is a 62 y.o. male who presents to the ED for concern of abdominal pain, this involves an extensive number of treatment options, and is a complaint that carries with it a high risk of complications and morbidity. The emergent differential diagnosis prior to evaluation includes, but is not limited to,  PUD, GERD, gastritis, pancreatitis, gastroparesis, malignancy, biliary disease, ACS, pericarditis, pneumonia, intestinal ischemia, esophageal rupture,  hepatitis   This is not an exhaustive differential.   Past Medical History / Co-morbidities / Social History:  has a past medical history of Arthritis, Asthma, Diverticulosis of colon, Dyslipidemia, GERD without esophagitis, Gout, Hyperlipidemia, Neuromuscular junction disorder (HCC), Piriformis syndrome, Shoulder impingement syndrome, right (03/2018), Umbilical hernia, and Varicocele.  Patient is Spanish-speaking only requiring interpreter services  Additional history: Chart reviewed. Pertinent results include: seen for similar symptoms on 11/18 and diagnosed gastritis after negative CT imaging. Given referral to GI, zofran, and protonix  Physical Exam: Physical exam performed. The pertinent findings include: abdomen soft and nontender  Lab Tests: I ordered, and personally interpreted  labs.  The pertinent results include:  Na 131, K 3.2, history of similar previously, likely due to decreased intake due to symptoms.    Imaging Studies:  Chest x-ray ordered by triage staff prior to my evaluation which has resulted and reveals NAD. I have personally reviewed and interpreted this imaging and agree with radiology interpretation.  Considered repeat CT imaging, however patients abdomen is soft and non-tender and symptoms similar to previous when patient had negative imaging.   Cardiac Monitoring:  The patient was maintained on a cardiac monitor. Cardiac monitor showed an underlying rhythm of: sinus rhythm, no STEMI. I agree with this interpretation.   Medications: I ordered medication including fluids for dehydration, hyponatremia. Oral potassium for hypokalemia, GI cocktail and bentyl and protonix for GERD. Reevaluation of the patient after these medicines showed that the patient improved. I have reviewed the patients home medicines and have made adjustments as needed.   Disposition: After consideration of the diagnostic results and the patients response to treatment, I feel that emergency  department workup does not suggest an emergent condition requiring admission or immediate intervention beyond what has been performed at this time. The plan is: discharge with protonix, carafate, and bentyl for symptomatic relief with close outpatient follow-up and return precautions.  Patient's symptoms likely due to acid reflux which he has been diagnosed with previously given his description of symptoms which have improved with medication given today.  He feels better and is ready to go home. He has good outpatient follow-up with both his primary doctor and his GI doctor. Evaluation and diagnostic testing in the emergency department does not suggest an emergent condition requiring admission or immediate intervention beyond what has been performed at this time.  Plan for discharge with close PCP follow-up.  Patient is understanding and amenable with plan, educated on red flag symptoms that would prompt immediate return.  Patient discharged in stable condition.  Final Clinical Impression(s) / ED Diagnoses Final diagnoses:  Gastroesophageal reflux disease, unspecified whether esophagitis present    Rx / DC Orders ED Discharge Orders          Ordered    sucralfate (CARAFATE) 1 g tablet  3 times daily        04/14/23 1432    dicyclomine (BENTYL) 20 MG tablet  2 times daily PRN        04/14/23 1432    pantoprazole (PROTONIX) 40 MG tablet  2 times daily        04/14/23 1432          An After Visit Summary was printed and given to the patient.     Vear Clock 04/14/23 1444    Ernie Avena, MD 04/14/23 [phone]

## 2023-04-14 NOTE — Discharge Instructions (Addendum)
As we discussed, your workup in the ER today was reassuring for acute findings.  Laboratory evaluation and x-ray imaging did not reveal any emergent cause of your pain.  I do suspect that your symptoms are due to acid reflux.  We have given you a prescription for Protonix and Carafate for you to take as prescribed every day for management of your symptoms.  Please take the Protonix first thing in the morning before you eat.  I have also given you a prescription for Bentyl which can help with stomach cramps and you can take this as prescribed as needed.  Please call your primary doctor to schedule a close follow-up appointment at your earliest convenience.   Return if development of any new or worsening symptoms.   Como comentamos, su examen de hoy en urgencias fue tranquilizador por los [employer].  La evaluacin de laboratorio y las imgenes de rayos X no revelaron ninguna causa emergente de Agricultural consultant.  Sospecho que sus sntomas se deben al reflujo cido.  Le hemos recetado Protonix y Carafate para que los tome segn lo prescrito todos los [address]  [phone] para controlar sus sntomas.  Tome Protonix a primera hora de la maana antes de comer.  Tambin le he recetado Bentyl, que puede ayudarle con los calambres estomacales y puede tomarlo segn lo prescrito segn sea necesario.  Llame a su mdico de atencin primaria para programar una cita de seguimiento cercano lo antes posible.   Regrese si desarrolla algn sntoma nuevo o que empeora.

## 2023-04-14 NOTE — ED Notes (Signed)
Called central telemetry and admitted the Pt to cardiac monitoring.

## 2023-04-14 NOTE — ED Triage Notes (Signed)
Spanish interpreter used for triage:  Pt c.o abd pain after he eats along with chest and throat pain x 8 days

## 2023-04-22 ENCOUNTER — Ambulatory Visit: Payer: No Typology Code available for payment source | Admitting: Family Medicine

## 2023-05-22 ENCOUNTER — Encounter: Payer: Self-pay | Admitting: Gastroenterology

## 2023-05-22 ENCOUNTER — Ambulatory Visit: Payer: No Typology Code available for payment source | Admitting: Gastroenterology

## 2023-05-22 VITALS — BP 114/72 | HR 80 | Ht 64.0 in | Wt 149.1 lb

## 2023-05-22 DIAGNOSIS — R1013 Epigastric pain: Secondary | ICD-10-CM | POA: Diagnosis not present

## 2023-05-22 DIAGNOSIS — K219 Gastro-esophageal reflux disease without esophagitis: Secondary | ICD-10-CM

## 2023-05-22 DIAGNOSIS — R1110 Vomiting, unspecified: Secondary | ICD-10-CM | POA: Diagnosis not present

## 2023-05-22 DIAGNOSIS — K2970 Gastritis, unspecified, without bleeding: Secondary | ICD-10-CM

## 2023-05-22 DIAGNOSIS — Z8619 Personal history of other infectious and parasitic diseases: Secondary | ICD-10-CM

## 2023-05-22 NOTE — Progress Notes (Signed)
Agree with assessment and plan as outlined.  

## 2023-05-22 NOTE — Patient Instructions (Addendum)
You have been scheduled for an endoscopy. Please follow written instructions given to you at your visit today.  If you use inhalers (even only as needed), please bring them with you on the day of your procedure.  If you take any of the following medications, they will need to be adjusted prior to your procedure:   DO NOT TAKE 7 DAYS PRIOR TO TEST- Trulicity (dulaglutide) Ozempic, Wegovy (semaglutide) Mounjaro (tirzepatide) Bydureon Bcise (exanatide extended release)  DO NOT TAKE 1 DAY PRIOR TO YOUR TEST Rybelsus (semaglutide) Adlyxin (lixisenatide) Victoza (liraglutide) Byetta (exanatide) ___________________________________________________________________________  Stop taking Carafate   If your blood pressure at your visit was 140/90 or greater, please contact your primary care physician to follow up on this.    If you are age 63 or older, your body mass index should be between 23-30. Your Body mass index is 25.6 kg/m. If this is out of the aforementioned range listed, please consider follow up with your Primary Care Provider.  If you are age 68 or younger, your body mass index should be between 19-25. Your Body mass index is 25.6 kg/m. If this is out of the aformentioned range listed, please consider follow up with your Primary Care Provider.     The [place] [facility] providers would like to encourage you to use [facility] to communicate with providers for non-urgent requests or questions.  Due to long hold times on the telephone, sending your provider a message by [facility] may be a faster and more efficient way to get a response.  Please allow 48 business hours for a response.  Please remember that this is for non-urgent requests.     Thank you for entrusting me with your care and choosing [facility].  Bayley Leanna Sato, PA-C

## 2023-05-22 NOTE — Progress Notes (Signed)
Chief Complaint: GERD Primary GI MD:Dr. Russella Dar  HPI: 63 year old male history of GERD, H. pylori gastritis, arthritis, and others as listed below presents for hospital follow-up.  Last seen May 2020 by Dr. Russella Dar. At that time patient was having RUQ pain and underwent EGD/colonoscopy.  EGD showed H. pylori gastritis and patient was treated with Pylera Appears patient did not complete eradication stool study  Recently seen at emergency department 03/19/2023 and 04/14/2023 for epigastric pain and GERD.  Labs notable for Normal CBC Normal lipase Hypokalemia with potassium 3.2, sodium 131.  Otherwise normal CMP CT abdomen pelvis with contrast 03/18/2023 shows no acute process.  Mild diffuse hepatic steatosis and hyperattenuating foci in the liver are too small to characterize and stable since 2019 study.  Normal gallbladder.  Normal pancreas  Translator present  Discussed the use of AI scribe software for clinical note transcription with the patient, who gave verbal consent to proceed.  The patient presents with a three-month history of severe gastritis and epigastric pain. He also reports a six-month history of excessive gas, which was manageable until about fifteen days ago. At that point, the patient began experiencing stomach pain after eating, leading to frequent bathroom visits without diarrhea.  Approximately two weeks ago, the patient started experiencing regurgitation, likened to the sensation of drinking a carbonated beverage and having it come back up. This symptom has since resolved. The patient denies current heartburn but reports a recent history of dry cough.  The patient was out of the country from January to August 2024 and suspects he may have contracted a bacterial infection during this time. He completed a course of antibiotics for H. Pylori in 2020 but did not undergo a follow-up stool test to confirm eradication of the bacteria.  The patient is currently on  pantoprazole and an antacid, which provide some relief. He denies taking any over-the-counter medications such as ibuprofen or NSAIDs.  PREVIOUS GI WORKUP   EGD 10/30/2018 for RUQ pain - Normal esophagus.  - Gastritis. Biopsied.  - Normal duodenal bulb and second portion of the duodenum.  Colonoscopy 10/30/2018 for change in bowel habits/diarrhea - One 6 mm polyp in the cecum, removed with a cold snare. Resected and retrieved.  - Mild diverticulosis in the left colon.  - Internal hemorrhoids.  - The examination was otherwise normal on direct and retroflexion views. - Repeat 10/2025  Diagnosis 1. Surgical [P], cecum, polyp - TUBULAR ADENOMA. - NO HIGH GRADE DYSPLASIA OR MALIGNANCY. 2. Surgical [P], random sites - BENIGN COLONIC MUCOSA. - NO ACTIVE INFLAMMATION OR EVIDENCE OF MICROSCOPIC COLITIS. - NO DYSPLASIA OR MALIGNANCY. 3. Surgical [P], gastric antrum and gastric body - CHRONIC ACTIVE GASTRITIS WITH HELICOBACTER PYLORI. - WARTHIN-STARRY IS POSITIVE FOR HELICOBACTER PYLORI. - NO INTESTINAL METAPLASIA, DYSPLASIA, OR MALIGNANCY.  Past Medical History:  Diagnosis Date   Arthritis    knees, arms   Asthma    pt. denies   Diverticulosis of colon    Dyslipidemia    GERD without esophagitis    occasional - no current med.   Gout    Hemorrhoids    Hyperlipidemia    Neuromuscular junction disorder (HCC)    Piriformis syndrome    Shoulder impingement syndrome, right 03/2018   Umbilical hernia    Varicocele     Past Surgical History:  Procedure Laterality Date   BICEPT TENODESIS Left 04/07/2018   Procedure: BICEPS TENODESIS;  Surgeon: Tarry Kos, MD;  Location: [facility];  Service: Orthopedics;  Laterality: Left;    Current Outpatient Medications  Medication Sig Dispense Refill   Multiple Vitamin (MULTIVITAMIN PO) Take 1 tablet by mouth daily.     pantoprazole (PROTONIX) 40 MG tablet Take 1 tablet (40 mg total) by mouth 2 (two) times daily. 60 tablet 0    PROCTO-MED HC 2.5 % rectal cream Place 1 Application rectally 2 (two) times daily as needed for hemorrhoids.     sucralfate (CARAFATE) 1 g tablet Take 1 tablet (1 g total) by mouth in the morning, at noon, and at bedtime. 90 tablet 0   triamcinolone cream (KENALOG) 0.1 % Apply 1 Application topically 2 (two) times daily as needed (Irritation).     atorvastatin (LIPITOR) 10 MG tablet atorvastatin 10 mg tablet  TAKE 1 TABLET BY MOUTH ONCE DAILY AT BEDTIME (Patient not taking: Reported on 03/18/2023)     dicyclomine (BENTYL) 20 MG tablet Take 1 tablet (20 mg total) by mouth 2 (two) times daily as needed for spasms. (Patient not taking: Reported on 05/22/2023) 20 tablet 0   No current facility-administered medications for this visit.    Allergies as of 05/22/2023   (No Known Allergies)    Family History  Problem Relation Age of Onset   Uterine cancer Mother        ? type   Other Father        old age   Kidney disease Sister    Colon cancer Neg Hx    Colon polyps Neg Hx    Esophageal cancer Neg Hx    Rectal cancer Neg Hx    Stomach cancer Neg Hx     Social History   Socioeconomic History   Marital status: Married    Spouse name: Not on file   Number of children: 3   Years of education: Not on file   Highest education level: Not on file  Occupational History   Not on file  Tobacco Use   Smoking status: Never   Smokeless tobacco: Never  Vaping Use   Vaping status: Never Used  Substance and Sexual Activity   Alcohol use: No   Drug use: No   Sexual activity: Not on file  Other Topics Concern   Not on file  Social History Narrative   Not on file   Social Drivers of Health   Financial Resource Strain: Not on file  Food Insecurity: Not on file  Transportation Needs: Not on file  Physical Activity: Not on file  Stress: Not on file  Social Connections: Not on file  Intimate Partner Violence: Not on file    Review of Systems:    Constitutional: No weight loss, fever,  chills, weakness or fatigue HEENT: Eyes: No change in vision               Ears, Nose, Throat:  No change in hearing or congestion Skin: No rash or itching Cardiovascular: No chest pain, chest pressure or palpitations   Respiratory: No SOB or cough Gastrointestinal: See HPI and otherwise negative Genitourinary: No dysuria or change in urinary frequency Neurological: No headache, dizziness or syncope Musculoskeletal: No new muscle or joint pain Hematologic: No bleeding or bruising Psychiatric: No history of depression or anxiety    Physical Exam:  Vital signs: BP 114/72   Pulse 80   Ht 5\' 4"  (1.626 m)   Wt 149 lb 2 oz (67.6 kg)   SpO2 98%   BMI 25.60 kg/m   Constitutional: NAD, Well developed, Well nourished, alert and  cooperative Head:  Normocephalic and atraumatic. Eyes:   PEERL, EOMI. No icterus. Conjunctiva pink. Respiratory: Respirations even and unlabored. Lungs clear to auscultation bilaterally.   No wheezes, crackles, or rhonchi.  Cardiovascular:  Regular rate and rhythm. No peripheral edema, cyanosis or pallor.  Gastrointestinal:  Soft, nondistended, nontender. No rebound or guarding. Normal bowel sounds. No appreciable masses or hepatomegaly. Rectal:  Not performed.  Msk:  Symmetrical without gross deformities. Without edema, no deformity or joint abnormality.  Neurologic:  Alert and  oriented x4;  grossly normal neurologically.  Skin:   Dry and intact without significant lesions or rashes. Psychiatric: Oriented to person, place and time. Demonstrates good judgement and reason without abnormal affect or behaviors.   RELEVANT LABS AND IMAGING: CBC    Component Value Date/Time   WBC 5.6 04/14/2023 1108   RBC 5.37 04/14/2023 1108   HGB 16.9 04/14/2023 1108   HCT 45.9 04/14/2023 1108   PLT 273 04/14/2023 1108   MCV 85.5 04/14/2023 1108   MCV 94.0 02/03/2018 1720   MCH 31.5 04/14/2023 1108   MCHC 36.8 (H) 04/14/2023 1108   RDW 11.4 (L) 04/14/2023 1108    LYMPHSABS 3.2 10/12/2017 0216   MONOABS 0.8 10/12/2017 0216   EOSABS 0.1 10/12/2017 0216   BASOSABS 0.1 10/12/2017 0216    CMP     Component Value Date/Time   NA 131 (L) 04/14/2023 1108   K 3.2 (L) 04/14/2023 1108   CL 95 (L) 04/14/2023 1108   CO2 25 04/14/2023 1108   GLUCOSE 119 (H) 04/14/2023 1108   BUN 7 (L) 04/14/2023 1108   CREATININE 0.99 04/14/2023 1108   CREATININE 0.97 12/24/2015 0854   CALCIUM 9.6 04/14/2023 1108   PROT 7.6 04/14/2023 1108   ALBUMIN 4.2 04/14/2023 1108   AST 20 04/14/2023 1108   ALT 22 04/14/2023 1108   ALKPHOS 59 04/14/2023 1108   BILITOT 1.0 04/14/2023 1108   GFRNONAA >60 04/14/2023 1108   GFRNONAA 89 12/24/2015 0854   GFRAA >60 10/12/2017 0216   GFRAA >89 12/24/2015 0854     Assessment/Plan:      Gastritis and possible H. Pylori infection Epigastric pain Regurgitation Recurrent epigastric pain, gas, and regurgitation. History of H. Pylori infection in 2020. Patient recently traveled out of the country and suspects possible reinfection.  No previous eradication study from last treatment. -Schedule first available EGD to evaluate for esophagitis, gastritis, PUD, H. pylori - I thoroughly discussed the procedure with the patient (at bedside) to include nature of the procedure, alternatives, benefits, and risks (including but not limited to bleeding, infection, perforation, anesthesia/cardiac pulmonary complications).  Patient verbalized understanding and gave verbal consent to proceed with procedure.  --Stop Pantoprazole and Carafate prior to the procedure. --If H. Pylori is positive, initiate appropriate antibiotic treatment. --Ensure post-treatment H. Pylori stool antigen test is performed to confirm eradication.  Lara Mulch [facility] 05/22/2023, 9:00 AM  Cc: Bing Neighbors, NP

## 2023-05-23 ENCOUNTER — Ambulatory Visit: Payer: No Typology Code available for payment source | Admitting: Gastroenterology

## 2023-05-23 ENCOUNTER — Encounter: Payer: Self-pay | Admitting: Gastroenterology

## 2023-05-23 VITALS — BP 110/73 | HR 81 | Temp 97.9°F | Resp 16 | Ht 64.0 in | Wt 149.0 lb

## 2023-05-23 DIAGNOSIS — R1013 Epigastric pain: Secondary | ICD-10-CM

## 2023-05-23 DIAGNOSIS — K31A11 Gastric intestinal metaplasia without dysplasia, involving the antrum: Secondary | ICD-10-CM

## 2023-05-23 DIAGNOSIS — K2950 Unspecified chronic gastritis without bleeding: Secondary | ICD-10-CM | POA: Diagnosis present

## 2023-05-23 MED ORDER — SODIUM CHLORIDE 0.9 % IV SOLN: 500.0000 mL | Freq: Once | INTRAVENOUS | Status: AC

## 2023-05-23 NOTE — Patient Instructions (Signed)
-   Resume previous diet. - Continue present medications. - Continue protonix if that has helped - Do not need to take carafate routinely, but can use PRN if it helps - Await pathology results.  YOU HAD AN ENDOSCOPIC PROCEDURE TODAY AT THE [facility]:   Refer to the procedure report that was given to you for any specific questions about what was found during the examination.  If the procedure report does not answer your questions, please call your gastroenterologist to clarify.  If you requested that your care partner not be given the details of your procedure findings, then the procedure report has been included in a sealed envelope for you to review at your convenience later.  YOU SHOULD EXPECT: Some feelings of bloating in the abdomen. Passage of more gas than usual.  Walking can help get rid of the air that was put into your GI tract during the procedure and reduce the bloating. If you had a lower endoscopy (such as a colonoscopy or flexible sigmoidoscopy) you may notice spotting of blood in your stool or on the toilet paper. If you underwent a bowel prep for your procedure, you may not have a normal bowel movement for a few days.  Please Note:  You might notice some irritation and congestion in your nose or some drainage.  This is from the oxygen used during your procedure.  There is no need for concern and it should clear up in a day or so.  SYMPTOMS TO REPORT IMMEDIATELY: Following upper endoscopy (EGD)  Vomiting of blood or coffee ground material  New chest pain or pain under the shoulder blades  Painful or persistently difficult swallowing  New shortness of breath  Fever of 100F or higher  Black, tarry-looking stools  For urgent or emergent issues, a gastroenterologist can be reached at any hour by calling [phone]. Do not use MyChart messaging for urgent concerns.    DIET:  We do recommend a small meal at first, but then you may proceed to your regular diet.   Drink plenty of fluids but you should avoid alcoholic beverages for 24 hours.  ACTIVITY:  You should plan to take it easy for the rest of today and you should NOT DRIVE or use heavy machinery until tomorrow (because of the sedation medicines used during the test).    FOLLOW UP: Our staff will call the number listed on your records the next business day following your procedure.  We will call around 7:15- 8:00 am to check on you and address any questions or concerns that you may have regarding the information given to you following your procedure. If we do not reach you, we will leave a message.     If any biopsies were taken you will be contacted by phone or by letter within the next 1-3 weeks.  Please call us at [phone] if you have not heard about the biopsies in 3 weeks.    SIGNATURES/CONFIDENTIALITY: You and/or your care partner have signed paperwork which will be entered into your electronic medical record.  These signatures attest to the fact that that the information above on your After Visit Summary has been reviewed and is understood.  Full responsibility of the confidentiality of this discharge information lies with you and/or your care-partner.

## 2023-05-23 NOTE — Progress Notes (Signed)
Called to room to assist during endoscopic procedure.  Patient ID and intended procedure confirmed with present staff. Received instructions for my participation in the procedure from the performing physician.  

## 2023-05-23 NOTE — Progress Notes (Signed)
Pt's states no medical or surgical changes since previsit or office visit.  Translator Timothy assisted in admissions.

## 2023-05-23 NOTE — Progress Notes (Signed)
Report given to PACU, vss 

## 2023-05-23 NOTE — Progress Notes (Signed)
1432 Robinul 0.1 mg IV given due large amount of secretions upon assessment.  MD made aware, vss  

## 2023-05-23 NOTE — Progress Notes (Signed)
History and Physical Interval Note: See yesterday's office note for details. HIstory of H pylori gastritis in the past without confirmation eradication testing. He has had recurrent upper abdominal discomfort, somewhat postprandial. Protonix has provided some relief recently at 40mg  BID but not resolved it. EGD to further evaluate. I have discussed risks / benefits and he wishes to proceed. Further recommendations pending results.   05/23/2023 2:52 PM  Jacob Dixon  has presented today for endoscopic procedure(s), with the diagnosis of  Encounter Diagnosis  Name Primary?   Abdominal pain, epigastric Yes  .  The various methods of evaluation and treatment have been discussed with the patient and/or family. After consideration of risks, benefits and other options for treatment, the patient has consented to  the endoscopic procedure(s).   The patient's history has been reviewed, patient examined, no change in status, stable for surgery.  I have reviewed the patient's chart and labs.  Questions were answered to the patient's satisfaction.    Harlin Rain, MD [facility]

## 2023-05-23 NOTE — Op Note (Signed)
[facility] Patient Name: Jacob Dixon Procedure Date: 05/23/2023 2:22 PM MRN: [id number] Endoscopist: Viviann Spare P. Adela Lank , MD, [id number] Age: 63 Referring MD:  Date of Birth: 1960-10-27 Gender: Male Account #: [id number] Procedure:                Upper GI endoscopy Indications:              Epigastric abdominal pain, history of H Pylori,                            negative CT imaging 03/2023. Currently on protonix                            40mg  BID and carafate which has provided some                            benefit. Medicines:                Monitored Anesthesia Care Procedure:                Pre-Anesthesia Assessment:                           - Prior to the procedure, a History and Physical                            was performed, and patient medications and                            allergies were reviewed. The patient's tolerance of                            previous anesthesia was also reviewed. The risks                            and benefits of the procedure and the sedation                            options and risks were discussed with the patient.                            All questions were answered, and informed consent                            was obtained. Prior Anticoagulants: The patient has                            taken no anticoagulant or antiplatelet agents. ASA                            Grade Assessment: II - A patient with mild systemic                            disease. After reviewing the risks and benefits,  the patient was deemed in satisfactory condition to                            undergo the procedure.                           After obtaining informed consent, the endoscope was                            passed under direct vision. Throughout the                            procedure, the patient's blood pressure, pulse, and                            oxygen saturations were monitored  continuously. The                            GIF [id number] #[id number] was introduced through the                            mouth, and advanced to the second part of duodenum.                            The upper GI endoscopy was accomplished without                            difficulty. The patient tolerated the procedure                            well. Scope In: Scope Out: Findings:                 Esophagogastric landmarks were identified: the                            Z-line was found at 35 cm, the gastroesophageal                            junction was found at 35 cm and the upper extent of                            the gastric folds was found at 35 cm from the                            incisors.                           The exam of the esophagus was otherwise normal.                           The entire examined stomach was normal. Biopsies                            were taken with a cold  forceps for Helicobacter                            pylori testing.                           The examined duodenum was normal. Biopsies for                            histology were taken with a cold forceps for                            evaluation of celiac disease. Complications:            No immediate complications. Estimated blood loss:                            Minimal. Estimated Blood Loss:     Estimated blood loss was minimal. Impression:               - Esophagogastric landmarks identified.                           - Normal esophagus.                           - Normal stomach. Biopsied.                           - Normal examined duodenum. Biopsied.                           No overt cause for symptoms on this exam but will                            await biopsies. He has had some improvement with                            PPI thus far. Recommendation:           - Patient has a contact number available for                            emergencies. The signs and symptoms of  potential                            delayed complications were discussed with the                            patient. Return to normal activities tomorrow.                            Written discharge instructions were provided to the                            patient.                           -  Resume previous diet.                           - Continue present medications.                           - Continue protonix if that has helped                           - Do not need to take carafate routinely, but can                            use PRN if it helps                           - Await pathology results.                           - If symptoms persist and biopsies negative,                            consideration for RUQ [place] to screen for gallstones Viviann Spare P. Adela Lank, MD 05/23/2023 3:35:26 PM This report has been signed electronically.

## 2023-05-24 ENCOUNTER — Telehealth: Payer: Self-pay

## 2023-05-24 NOTE — Telephone Encounter (Signed)
Attempted to reach patient for post-procedure f/u call. No answer. Left message for him to please not hesitate to call if he has any questions/concerns regarding his care.

## 2023-05-28 LAB — SURGICAL PATHOLOGY

## 2023-06-05 ENCOUNTER — Other Ambulatory Visit: Payer: Self-pay

## 2023-06-05 DIAGNOSIS — R1013 Epigastric pain: Secondary | ICD-10-CM

## 2023-06-11 ENCOUNTER — Ambulatory Visit (HOSPITAL_COMMUNITY)
Admission: RE | Admit: 2023-06-11 | Discharge: 2023-06-11 | Disposition: A | Discharge status: Home / Self Care | Payer: No Typology Code available for payment source | Source: Ambulatory Visit | Attending: Gastroenterology | Admitting: Gastroenterology

## 2023-06-11 DIAGNOSIS — R1013 Epigastric pain: Secondary | ICD-10-CM | POA: Insufficient documentation

## 2023-06-19 ENCOUNTER — Ambulatory Visit
Admission: RE | Admit: 2023-06-19 | Discharge: 2023-06-19 | Disposition: A | Discharge status: Home / Self Care | Payer: No Typology Code available for payment source | Source: Ambulatory Visit | Attending: Family Medicine | Admitting: Family Medicine

## 2023-06-19 ENCOUNTER — Ambulatory Visit: Payer: No Typology Code available for payment source | Admitting: Physician Assistant

## 2023-06-19 ENCOUNTER — Other Ambulatory Visit: Payer: Self-pay

## 2023-06-19 VITALS — BP 135/79 | HR 72 | Temp 98.1°F | Resp 16

## 2023-06-19 DIAGNOSIS — R300 Dysuria: Secondary | ICD-10-CM | POA: Diagnosis not present

## 2023-06-19 DIAGNOSIS — R350 Frequency of micturition: Secondary | ICD-10-CM

## 2023-06-19 LAB — POCT URINALYSIS DIP (MANUAL ENTRY)
Bilirubin, UA: NEGATIVE
Blood, UA: NEGATIVE
Glucose, UA: NEGATIVE mg/dL
Ketones, POC UA: NEGATIVE mg/dL
Nitrite, UA: NEGATIVE
Protein Ur, POC: NEGATIVE mg/dL
Spec Grav, UA: 1.015 (ref 1.010–1.025)
Urobilinogen, UA: 0.2 U/dL
pH, UA: 8.5 — AB (ref 5.0–8.0)

## 2023-06-19 MED ORDER — OXYBUTYNIN CHLORIDE 5 MG PO TABS: 5.0000 mg | ORAL_TABLET | Freq: Two times a day (BID) | ORAL | 0 refills | Status: DC

## 2023-06-19 NOTE — ED Triage Notes (Signed)
 Pt reports low abdominal pain, low back pain, increase urinary frequency, burning when urinating, penis pain when urinating, urinary frequency x 2 months. Reports he had ultrasound, colonoscopy, urinalysis and results were normal. Pt thinks this urine infection is not normal, as he is not getting better.

## 2023-06-19 NOTE — ED Provider Notes (Signed)
 [facility]  Note:  This document was prepared using Conservation officer, historic buildings and may include unintentional dictation errors.  MRN: [id number] DOB: 05/17/1960  Subjective:   Jacob Dixon is a 63 y.o. male presenting for several month history of persistent urinary frequency, urgency, and intermittent dysuria and low back pain.  No penile discharge.  Has never seen a urologist.  Has a history of lower urinary tract symptoms.  Does not take any chronic medications for this.  Urinary flow is fairly normal.  At the end of urination, reports some dribbling that can sting.  No fever, nausea, vomiting, hematuria.  No history of renal stones.  Hydrates very well daily.  Generally has 1-2 drinks of fruit juice per week.  Has 1 cup of coffee daily.  No other urinary irritants.   Current Facility-Administered Medications:    0.9 %  sodium chloride infusion, 500 mL, Intravenous, Once, Armbruster, Willaim Rayas, MD  Current Outpatient Medications:    atorvastatin (LIPITOR) 10 MG tablet, atorvastatin 10 mg tablet  TAKE 1 TABLET BY MOUTH ONCE DAILY AT BEDTIME (Patient not taking: Reported on 03/18/2023), Disp: , Rfl:    dicyclomine (BENTYL) 20 MG tablet, Take 1 tablet (20 mg total) by mouth 2 (two) times daily as needed for spasms. (Patient not taking: Reported on 05/22/2023), Disp: 20 tablet, Rfl: 0   Multiple Vitamin (MULTIVITAMIN PO), Take 1 tablet by mouth daily., Disp: , Rfl:    pantoprazole (PROTONIX) 40 MG tablet, Take 1 tablet (40 mg total) by mouth 2 (two) times daily. (Patient not taking: Reported on 05/23/2023), Disp: 60 tablet, Rfl: 0   PROCTO-MED HC 2.5 % rectal cream, Place 1 Application rectally 2 (two) times daily as needed for hemorrhoids., Disp: , Rfl:    sucralfate (CARAFATE) 1 g tablet, Take 1 tablet (1 g total) by mouth in the morning, at noon, and at bedtime. (Patient not taking: Reported on 05/23/2023), Disp: 90 tablet, Rfl: 0   triamcinolone cream  (KENALOG) 0.1 %, Apply 1 Application topically 2 (two) times daily as needed (Irritation)., Disp: , Rfl:    No Known Allergies  Past Medical History:  Diagnosis Date   Arthritis    knees, arms   Asthma    pt. denies   Diverticulosis of colon    Dyslipidemia    GERD without esophagitis    occasional - no current med.   Gout    Hemorrhoids    Hyperlipidemia    Neuromuscular junction disorder (HCC)    Piriformis syndrome    Shoulder impingement syndrome, right 03/2018   Umbilical hernia    Varicocele      Past Surgical History:  Procedure Laterality Date   BICEPT TENODESIS Left 04/07/2018   Procedure: BICEPS TENODESIS;  Surgeon: Tarry Kos, MD;  Location: [facility];  Service: Orthopedics;  Laterality: Left;    Family History  Problem Relation Age of Onset   Uterine cancer Mother        ? type   Other Father        old age   Kidney disease Sister    Colon cancer Neg Hx    Colon polyps Neg Hx    Esophageal cancer Neg Hx    Rectal cancer Neg Hx    Stomach cancer Neg Hx     Social History   Tobacco Use   Smoking status: Never   Smokeless tobacco: Never  Vaping Use   Vaping status: Never Used  Substance Use Topics   Alcohol use: No   Drug use: Never    ROS   Objective:   Vitals: BP 135/79 (BP Location: Left Arm)   Pulse 72   Temp 98.1 F (36.7 C) (Oral)   Resp 16   SpO2 98%   Physical Exam Constitutional:      General: He is not in acute distress.    Appearance: Normal appearance. He is well-developed and normal weight. He is not ill-appearing, toxic-appearing or diaphoretic.  HENT:     Head: Normocephalic and atraumatic.     Right Ear: Tympanic membrane, ear canal and external ear normal. There is no impacted cerumen.     Left Ear: Tympanic membrane, ear canal and external ear normal. There is no impacted cerumen.     Nose: Nose normal.     Mouth/Throat:     Pharynx: Oropharynx is clear.  Eyes:     General: No scleral  icterus.       Right eye: No discharge.        Left eye: No discharge.     Extraocular Movements: Extraocular movements intact.  Cardiovascular:     Rate and Rhythm: Normal rate.  Pulmonary:     Effort: Pulmonary effort is normal.  Musculoskeletal:     Cervical back: Normal range of motion.  Neurological:     Mental Status: He is alert and oriented to person, place, and time.  Psychiatric:        Mood and Affect: Mood normal.        Behavior: Behavior normal.        Thought Content: Thought content normal.        Judgment: Judgment normal.    Results for orders placed or performed during the hospital encounter of 06/19/23 (from the past 24 hours)  POCT urinalysis dipstick     Status: Abnormal   Collection Time: 06/19/23 10:01 AM  Result Value Ref Range   Color, UA yellow yellow   Clarity, UA clear clear   Glucose, UA negative negative mg/dL   Bilirubin, UA negative negative   Ketones, POC UA negative negative mg/dL   Spec Grav, UA 1.610 9.604 - 1.025   Blood, UA negative negative   pH, UA 8.5 (A) 5.0 - 8.0   Protein Ur, POC negative negative mg/dL   Urobilinogen, UA 0.2 0.2 or 1.0 E.U./dL   Nitrite, UA Negative Negative   Leukocytes, UA Trace (A) Negative     Assessment and Plan :   PDMP not reviewed this encounter.  1. Urinary frequency   2. Dysuria    Recommended oxybutynin as a trial for his lower urinary tract symptoms.  Continue to hydrate well.  Urine culture pending.  Follow-up with urology.  Counseled patient on potential for adverse effects with medications prescribed/recommended today, ER and return-to-clinic precautions discussed, patient verbalized understanding.    Wallis Bamberg, PA-C 06/19/23 1014

## 2023-06-22 LAB — URINE CULTURE: Organism ID, Bacteria: NO GROWTH

## 2023-06-22 LAB — SPECIMEN STATUS REPORT

## 2023-07-09 NOTE — Progress Notes (Deleted)
 New Patient Office Visit  Subjective    Patient ID: Jacob Dixon, male    DOB: 23-Nov-1960  Age: 63 y.o. MRN: [id number]  CC: No chief complaint on file.   HPI Jacob Dixon presents to establish care today.   Outpatient Encounter Medications as of 07/10/2023  Medication Sig  . atorvastatin (LIPITOR) 10 MG tablet atorvastatin 10 mg tablet  TAKE 1 TABLET BY MOUTH ONCE DAILY AT BEDTIME (Patient not taking: Reported on 03/18/2023)  . dicyclomine (BENTYL) 20 MG tablet Take 1 tablet (20 mg total) by mouth 2 (two) times daily as needed for spasms. (Patient not taking: Reported on 05/22/2023)  . Multiple Vitamin (MULTIVITAMIN PO) Take 1 tablet by mouth daily.  Marland Kitchen oxybutynin (DITROPAN) 5 MG tablet Take 1 tablet (5 mg total) by mouth 2 (two) times daily.  . pantoprazole (PROTONIX) 40 MG tablet Take 1 tablet (40 mg total) by mouth 2 (two) times daily. (Patient not taking: Reported on 05/23/2023)  . PROCTO-MED HC 2.5 % rectal cream Place 1 Application rectally 2 (two) times daily as needed for hemorrhoids.  . sucralfate (CARAFATE) 1 g tablet Take 1 tablet (1 g total) by mouth in the morning, at noon, and at bedtime. (Patient not taking: Reported on 05/23/2023)  . triamcinolone cream (KENALOG) 0.1 % Apply 1 Application topically 2 (two) times daily as needed (Irritation).   Facility-Administered Encounter Medications as of 07/10/2023  Medication  . 0.9 %  sodium chloride infusion    Past Medical History:  Diagnosis Date  . Arthritis    knees, arms  . Asthma    pt. denies  . Diverticulosis of colon   . Dyslipidemia   . GERD without esophagitis    occasional - no current med.  . Gout   . Hemorrhoids   . Hyperlipidemia   . Neuromuscular junction disorder (HCC)   . Piriformis syndrome   . Shoulder impingement syndrome, right 03/2018  . Umbilical hernia   . Varicocele     Past Surgical History:  Procedure Laterality Date  . BICEPT TENODESIS Left 04/07/2018   Procedure:  BICEPS TENODESIS;  Surgeon: Tarry Kos, MD;  Location: [facility];  Service: Orthopedics;  Laterality: Left;    Family History  Problem Relation Age of Onset  . Uterine cancer Mother        ? type  . Other Father        old age  . Kidney disease Sister   . Colon cancer Neg Hx   . Colon polyps Neg Hx   . Esophageal cancer Neg Hx   . Rectal cancer Neg Hx   . Stomach cancer Neg Hx     Social History   Socioeconomic History  . Marital status: Married    Spouse name: Not on file  . Number of children: 3  . Years of education: Not on file  . Highest education level: Not on file  Occupational History  . Not on file  Tobacco Use  . Smoking status: Never  . Smokeless tobacco: Never  Vaping Use  . Vaping status: Never Used  Substance and Sexual Activity  . Alcohol use: No  . Drug use: Never  . Sexual activity: Not Currently  Other Topics Concern  . Not on file  Social History Narrative  . Not on file   Social Drivers of Health   Financial Resource Strain: Not on file  Food Insecurity: Not on file  Transportation Needs: Not on file  Physical Activity:  Not on file  Stress: Not on file  Social Connections: Not on file  Intimate Partner Violence: Not on file    ROS Per HPI      Objective    There were no vitals taken for this visit.  Physical Exam Vitals and nursing note reviewed.  Constitutional:      Appearance: Normal appearance.  HENT:     Head: Normocephalic and atraumatic.  Eyes:     Extraocular Movements: Extraocular movements intact.  Cardiovascular:     Rate and Rhythm: Normal rate and regular rhythm.     Pulses: Normal pulses.     Heart sounds: Normal heart sounds.  Pulmonary:     Effort: Pulmonary effort is normal.     Breath sounds: Normal breath sounds.  Musculoskeletal:        General: Normal range of motion.     Cervical back: Normal range of motion.  Skin:    General: Skin is warm and dry.  Neurological:      General: No focal deficit present.     Mental Status: He is alert and oriented to person, place, and time.  Psychiatric:        Mood and Affect: Mood normal.        Behavior: Behavior normal.       Assessment & Plan:   There are no diagnoses linked to this encounter.   No follow-ups on file.   Moshe Cipro, FNP

## 2023-07-10 ENCOUNTER — Ambulatory Visit: Payer: No Typology Code available for payment source | Admitting: Family Medicine

## 2023-07-30 NOTE — Progress Notes (Deleted)
 07/30/2023 Jacob Dixon [id number] 11-09-1960  Referring provider: Bing Neighbors, NP Primary GI doctor: Dr. Adela Lank  ASSESSMENT AND PLAN:   Assessment and Plan          Epigastric discomfort with history of H. pylori infection 2020, distended gallbladder without evidence of stones or biliary dilation RUQ 06/2023 05/23/2023 EGD Normal esophagus normal stomach and normal duodenum, neg H pylori and celiac, did show chronic inflammation and focal intestinal metaplasia, consider recall endoscopy for surveillance 2 to 3 years.  History of TA polyp Recall colonoscopy 10/2025  Hepatic steatosis Seen on CT abdomen pelvis in the hospital 03/18/2023 with hyper attenuating foci in the liver too small to characterize stable since 2019, normal gallbladder normal pancreas normal spleen RUQ US hepatic steatosis simple left hepatic cyst, distended gallbladder without evidence of gallstones no biliary dilation Unremarkable liver function 04/14/2023, normal platelets  Patient Care Team: Bing Neighbors, NP as PCP - General (Family Medicine)  HISTORY OF PRESENT ILLNESS: 63 y.o. Spanish-speaking male with a past medical history of GERD, H. pylori gastritis and others listed below presents for evaluation of fatty liver/gastritis.     Discussed the use of AI scribe software for clinical note transcription with the patient, who gave verbal consent to proceed.  History of Present Illness            He  reports that he has never smoked. He has never used smokeless tobacco. He reports that he does not drink alcohol and does not use drugs.  RELEVANT GI HISTORY, IMAGING AND LABS: Results         EGD 10/30/2018 for RUQ pain - Normal esophagus.  - Gastritis. Biopsied.  - Normal duodenal bulb and second portion of the duodenum.   Colonoscopy 10/30/2018 for change in bowel habits/diarrhea - One 6 mm polyp in the cecum, removed with a cold snare. Resected and retrieved.  - Mild  diverticulosis in the left colon.  - Internal hemorrhoids.  - The examination was otherwise normal on direct and retroflexion views. - Repeat 10/2025   Diagnosis 1. Surgical [P], cecum, polyp - TUBULAR ADENOMA. - NO HIGH GRADE DYSPLASIA OR MALIGNANCY. 2. Surgical [P], random sites - BENIGN COLONIC MUCOSA. - NO ACTIVE INFLAMMATION OR EVIDENCE OF MICROSCOPIC COLITIS. - NO DYSPLASIA OR MALIGNANCY. 3. Surgical [P], gastric antrum and gastric body - CHRONIC ACTIVE GASTRITIS WITH HELICOBACTER PYLORI. - WARTHIN-STARRY IS POSITIVE FOR HELICOBACTER PYLORI. - NO INTESTINAL METAPLASIA, DYSPLASIA, OR MALIGNANCY.   CBC    Component Value Date/Time   WBC 5.6 04/14/2023 1108   RBC 5.37 04/14/2023 1108   HGB 16.9 04/14/2023 1108   HCT 45.9 04/14/2023 1108   PLT 273 04/14/2023 1108   MCV 85.5 04/14/2023 1108   MCV 94.0 02/03/2018 1720   MCH 31.5 04/14/2023 1108   MCHC 36.8 (H) 04/14/2023 1108   RDW 11.4 (L) 04/14/2023 1108   LYMPHSABS 3.2 10/12/2017 0216   MONOABS 0.8 10/12/2017 0216   EOSABS 0.1 10/12/2017 0216   BASOSABS 0.1 10/12/2017 0216   Recent Labs    03/18/23 0915 04/14/23 1108  HGB 16.0 16.9    CMP     Component Value Date/Time   NA 131 (L) 04/14/2023 1108   K 3.2 (L) 04/14/2023 1108   CL 95 (L) 04/14/2023 1108   CO2 25 04/14/2023 1108   GLUCOSE 119 (H) 04/14/2023 1108   BUN 7 (L) 04/14/2023 1108   CREATININE 0.99 04/14/2023 1108   CREATININE 0.97 12/24/2015 0854  CALCIUM 9.6 04/14/2023 1108   PROT 7.6 04/14/2023 1108   ALBUMIN 4.2 04/14/2023 1108   AST 20 04/14/2023 1108   ALT 22 04/14/2023 1108   ALKPHOS 59 04/14/2023 1108   BILITOT 1.0 04/14/2023 1108   GFRNONAA >60 04/14/2023 1108   GFRNONAA 89 12/24/2015 0854   GFRAA >60 10/12/2017 0216   GFRAA >89 12/24/2015 0854      Latest Ref Rng & Units 04/14/2023   11:08 AM 03/18/2023    9:15 AM 10/12/2017    2:16 AM  Hepatic Function  Total Protein 6.5 - 8.1 g/dL 7.6  7.3  7.2   Albumin 3.5 - 5.0 g/dL 4.2   4.2  4.2   AST 15 - 41 U/L 20  21  21    ALT 0 - 44 U/L 22  26  32   Alk Phosphatase 38 - 126 U/L 59  51  62   Total Bilirubin <1.2 mg/dL 1.0  1.0  0.9       Current Medications:     Current Outpatient Medications (Cardiovascular):    atorvastatin (LIPITOR) 10 MG tablet, atorvastatin 10 mg tablet  TAKE 1 TABLET BY MOUTH ONCE DAILY AT BEDTIME (Patient not taking: Reported on 03/18/2023)         Current Outpatient Medications (Other):    dicyclomine (BENTYL) 20 MG tablet, Take 1 tablet (20 mg total) by mouth 2 (two) times daily as needed for spasms. (Patient not taking: Reported on 05/22/2023)   Multiple Vitamin (MULTIVITAMIN PO), Take 1 tablet by mouth daily.   oxybutynin (DITROPAN) 5 MG tablet, Take 1 tablet (5 mg total) by mouth 2 (two) times daily.   pantoprazole (PROTONIX) 40 MG tablet, Take 1 tablet (40 mg total) by mouth 2 (two) times daily. (Patient not taking: Reported on 05/23/2023)   PROCTO-MED HC 2.5 % rectal cream, Place 1 Application rectally 2 (two) times daily as needed for hemorrhoids.   sucralfate (CARAFATE) 1 g tablet, Take 1 tablet (1 g total) by mouth in the morning, at noon, and at bedtime. (Patient not taking: Reported on 05/23/2023)   triamcinolone cream (KENALOG) 0.1 %, Apply 1 Application topically 2 (two) times daily as needed (Irritation).  Current Facility-Administered Medications (Other):    0.9 %  sodium chloride infusion  Medical History:  Past Medical History:  Diagnosis Date   Arthritis    knees, arms   Asthma    pt. denies   Diverticulosis of colon    Dyslipidemia    GERD without esophagitis    occasional - no current med.   Gout    Hemorrhoids    Hyperlipidemia    Neuromuscular junction disorder (HCC)    Piriformis syndrome    Shoulder impingement syndrome, right 03/2018   Umbilical hernia    Varicocele    Allergies: No Known Allergies   Surgical History:  He  has a past surgical history that includes Bicept tenodesis (Left,  04/07/2018). Family History:  His family history includes Kidney disease in his sister; Other in his father; Uterine cancer in his mother.  REVIEW OF SYSTEMS  : All other systems reviewed and negative except where noted in the History of Present Illness.  PHYSICAL EXAM: There were no vitals taken for this visit. Physical Exam          Doree Albee, PA-C 7:42 AM

## 2023-07-31 ENCOUNTER — Ambulatory Visit: Payer: No Typology Code available for payment source | Admitting: Physician Assistant

## 2023-08-07 NOTE — Progress Notes (Unsigned)
 Celso Amy, PA-C [address] [place], [place]  [address] Phone: [phone]   Primary Care Physician: Bing Neighbors, NP  Primary Gastroenterologist:  Celso Amy, PA-C / Ileene Patrick, MD   Chief Complaint:  F/U GERD and upper abdominal pain      HPI:   Adael Culbreath is a 63 y.o. male presented for 63-month follow-up of GERD, gastritis, and upper abdominal pain.   He has had episodes of epigastric and RUQ pain.  History of H. pylori and treated with Pylera in 2020.  We are using Spanish interpreter today.  03/2023 abdominal pelvic CT with contrast: No acute abnormality.  Multiple bilateral benign-appearing kidney cysts.  Mild hepatic steatosis.  No liver masses.  Normal gallbladder.    06/2023 RUQ abdominal ultrasound: Mild diffuse hepatic steatosis, 1.0 cm simple left hepatic cyst.  Normal gallbladder with no gallstones.  CBD 4 mm.  05/23/2023 EGD by Dr. Adela Lank: Normal.  Biopsies negative for H. pylori and celiac.  10/2018 EGD by Dr. Russella Dar: Chronic active H. pylori gastritis.  Normal esophagus and duodenum.  Treated with Pylera.  10/2018 Colonoscopy by Dr. Russella Dar: One 6 mm tubular adenoma removed from cecum.  Mild diverticulosis and internal hemorrhoids.  Biopsies negative for microscopic colitis.  7-year repeat (due 10/2025).  Current symptoms and treatment: Currently taking pantoprazole 40 Mg twice daily and sucralfate 1 g 3 times daily as needed.  Also takes dicyclomine 20 Mg 3 times daily as needed for abdominal pain.   Current Outpatient Medications  Medication Sig Dispense Refill   atorvastatin (LIPITOR) 10 MG tablet atorvastatin 10 mg tablet  TAKE 1 TABLET BY MOUTH ONCE DAILY AT BEDTIME (Patient not taking: Reported on 03/18/2023)     dicyclomine (BENTYL) 20 MG tablet Take 1 tablet (20 mg total) by mouth 2 (two) times daily as needed for spasms. (Patient not taking: Reported on 05/22/2023) 20 tablet 0   Multiple Vitamin (MULTIVITAMIN PO) Take 1  tablet by mouth daily.     oxybutynin (DITROPAN) 5 MG tablet Take 1 tablet (5 mg total) by mouth 2 (two) times daily. 60 tablet 0   pantoprazole (PROTONIX) 40 MG tablet Take 1 tablet (40 mg total) by mouth 2 (two) times daily. (Patient not taking: Reported on 05/23/2023) 60 tablet 0   PROCTO-MED HC 2.5 % rectal cream Place 1 Application rectally 2 (two) times daily as needed for hemorrhoids.     sucralfate (CARAFATE) 1 g tablet Take 1 tablet (1 g total) by mouth in the morning, at noon, and at bedtime. (Patient not taking: Reported on 05/23/2023) 90 tablet 0   triamcinolone cream (KENALOG) 0.1 % Apply 1 Application topically 2 (two) times daily as needed (Irritation).     Current Facility-Administered Medications  Medication Dose Route Frequency Provider Last Rate Last Admin   0.9 %  sodium chloride infusion  500 mL Intravenous Once Armbruster, Willaim Rayas, MD        Allergies as of 08/08/2023   (No Known Allergies)    Past Medical History:  Diagnosis Date   Arthritis    knees, arms   Asthma    pt. denies   Diverticulosis of colon    Dyslipidemia    GERD without esophagitis    occasional - no current med.   Gout    Hemorrhoids    Hyperlipidemia    Neuromuscular junction disorder (HCC)    Piriformis syndrome    Shoulder impingement syndrome, right 03/2018   Umbilical hernia  Varicocele     Past Surgical History:  Procedure Laterality Date   BICEPT TENODESIS Left 04/07/2018   Procedure: BICEPS TENODESIS;  Surgeon: Tarry Kos, MD;  Location: [facility];  Service: Orthopedics;  Laterality: Left;    Review of Systems:    All systems reviewed and negative except where noted in HPI.    Physical Exam:  There were no vitals taken for this visit. No LMP for male patient.  General: Well-nourished, well-developed in no acute distress.  Lungs: Clear to auscultation bilaterally. Non-labored. Heart: Regular rate and rhythm, no murmurs rubs or gallops.  Abdomen:  Bowel sounds are normal; Abdomen is Soft; No hepatosplenomegaly, masses or hernias;  No Abdominal Tenderness; No guarding or rebound tenderness. Neuro: Alert and oriented x 3.  Grossly intact.  Psych: Alert and cooperative, normal mood and affect.   Imaging Studies: No results found.  Assessment and Plan:   Tiras Bianchini is a 63 y.o. y/o male.  For follow-up of chronic abdominal pain.  History of GERD and gastritis.  Currently taking PPI.  History of H. pylori gastritis treated with Pylera in 2020.  EGD 05/2023 was normal and biopsies negative for H. pylori confirming eradication.  Recent RUQ ultrasound and abdominal pelvic CT showed no acute abnormality to explain abdominal pain.  1.  Chronic abdominal pain  Returns regarding recent normal EGD, abdominal/pelvic CT, and RUQ ultrasound.  Continue dicyclomine  2.  GERD  Continue pantoprazole and sucralfate  3.  History of H. pylori gastritis treated with Pylera in 2020, eradicated.  Reassurance regarding recent Negative H. pylori gastric biopsy.  4.  History of adenomatous colon polyps  7-year repeat colonoscopy will be due    Celso Amy, PA-C  Follow up ***

## 2023-08-08 ENCOUNTER — Encounter: Payer: Self-pay | Admitting: Physician Assistant

## 2023-08-08 ENCOUNTER — Ambulatory Visit: Admitting: Physician Assistant

## 2023-08-08 ENCOUNTER — Ambulatory Visit: Admitting: Family Medicine

## 2023-08-08 VITALS — BP 100/60 | HR 95 | Ht 65.0 in | Wt 144.0 lb

## 2023-08-08 DIAGNOSIS — K581 Irritable bowel syndrome with constipation: Secondary | ICD-10-CM

## 2023-08-08 DIAGNOSIS — K5904 Chronic idiopathic constipation: Secondary | ICD-10-CM | POA: Diagnosis not present

## 2023-08-08 DIAGNOSIS — K648 Other hemorrhoids: Secondary | ICD-10-CM | POA: Diagnosis not present

## 2023-08-08 DIAGNOSIS — Z8619 Personal history of other infectious and parasitic diseases: Secondary | ICD-10-CM

## 2023-08-08 DIAGNOSIS — K760 Fatty (change of) liver, not elsewhere classified: Secondary | ICD-10-CM

## 2023-08-08 DIAGNOSIS — K219 Gastro-esophageal reflux disease without esophagitis: Secondary | ICD-10-CM

## 2023-08-08 DIAGNOSIS — Z860101 Personal history of adenomatous and serrated colon polyps: Secondary | ICD-10-CM

## 2023-08-08 DIAGNOSIS — G8929 Other chronic pain: Secondary | ICD-10-CM

## 2023-08-08 MED ORDER — POLYETHYLENE GLYCOL 3350 17 G PO PACK: 17.0000 g | PACK | Freq: Every day | ORAL | Status: AC

## 2023-08-08 MED ORDER — PANTOPRAZOLE SODIUM 40 MG PO TBEC: 40.0000 mg | DELAYED_RELEASE_TABLET | Freq: Every day | ORAL | 3 refills | Status: AC

## 2023-08-08 MED ORDER — HYDROCORTISONE ACETATE 25 MG RE SUPP: 25.0000 mg | Freq: Every day | RECTAL | 1 refills | Status: AC

## 2023-08-08 NOTE — Progress Notes (Signed)
 Agree with assessment and plan as outlined.

## 2023-08-08 NOTE — Patient Instructions (Signed)
   For constipation: Start OTC Miralax Powder Mix 1 capful in 6 to 8 ounces of a drink once daily  Recommend high-fiber diet, 30 g of fiber daily Eat fruits, vegetables, and whole grains Drink 64 ounces of water / fluids daily.

## 2023-08-28 ENCOUNTER — Telehealth: Payer: Self-pay | Admitting: Physician Assistant

## 2023-08-28 NOTE — Telephone Encounter (Signed)
 Jacob Dixon-  Patient seen 08/08/23. He had chronic abdominal pain, CIC and internal hemorrhoids. Hx adenomatous colon polyps with 7 year recall planned (2027). Was advised to start Miralax  for constipation and hydrocortisone  suppositories at bedtime x 12 days.   Patient is calling today to discuss hemorrhoidal banding due to continued symptoms and rectal pain. Are his hemorrhoids amendable to banding where I could go ahead and schedule for banding or would he need additional follow up to determine that?

## 2023-08-28 NOTE — Telephone Encounter (Signed)
 Inbound call from patient, states he would like to discuss hemorrhoid bandings with a nurse, patient states his hemorrhoids are extremely painful and he was advised they could be banded to help.

## 2023-08-29 NOTE — Telephone Encounter (Signed)
 Attempted to reach patient (with help of [address] spanish interpreter, [place] [id number]). No answer. Left voicemail for patient to call back.  He is scheduled with Brigitte Canard, PA-C on 09/25/23 at 8:20 am so anoscopy can be performed and recommendation can be made in regards to hemorrhoidal banding.

## 2023-08-30 NOTE — Telephone Encounter (Signed)
 Left message for patient to call back via [address], [address] [place] [phone].

## 2023-09-02 NOTE — Telephone Encounter (Signed)
 Attempted to reach patient by phone again. No answer. Will instead send a mychart message to patient in hopes that he will respond.

## 2023-09-05 ENCOUNTER — Ambulatory Visit: Admitting: Physician Assistant

## 2023-09-09 ENCOUNTER — Encounter: Payer: Self-pay | Admitting: Gastroenterology

## 2023-09-09 ENCOUNTER — Ambulatory Visit: Admitting: Gastroenterology

## 2023-09-09 VITALS — BP 122/68 | HR 80 | Ht 65.0 in | Wt 149.1 lb

## 2023-09-09 DIAGNOSIS — K5904 Chronic idiopathic constipation: Secondary | ICD-10-CM

## 2023-09-09 DIAGNOSIS — Z8619 Personal history of other infectious and parasitic diseases: Secondary | ICD-10-CM

## 2023-09-09 DIAGNOSIS — K602 Anal fissure, unspecified: Secondary | ICD-10-CM | POA: Diagnosis not present

## 2023-09-09 DIAGNOSIS — K648 Other hemorrhoids: Secondary | ICD-10-CM | POA: Diagnosis not present

## 2023-09-09 DIAGNOSIS — Z860100 Personal history of colon polyps, unspecified: Secondary | ICD-10-CM

## 2023-09-09 MED ORDER — NON FORMULARY: 1 refills | Status: DC

## 2023-09-09 NOTE — Patient Instructions (Addendum)
 _______________________________________________________  If your blood pressure at your visit was 140/90 or greater, please contact your primary care physician to follow up on this.  If you are age 62 or younger, your body mass index should be between 19-25. Your Body mass index is 24.82 kg/m. If this is out of the aformentioned range listed, please consider follow up with your Primary Care Provider.  ________________________________________________________  The [facility] providers would like to encourage you to use MYCHART to communicate with providers for non-urgent requests or questions.  Due to long hold times on the telephone, sending your provider a message by [facility] may be a faster and more efficient way to get a response.  Please allow 48 business hours for a response.  Please remember that this is for non-urgent requests.  _______________________________________________________  We have sent a prescription for Diltiazem 2% gel to [facility] for you. Using your index finger, you should apply a small amount of medication inside the rectum up to your first knuckle/joint three times per day for 6 to 8 weeks.  [facility]'s information is below: Address: [address], [place], [place] [address]  [phone]  *Please DO NOT go directly from our office to pick up this medication! Give the pharmacy 1 day to process the prescription as this is compounded and takes time to make.  Using your index finger, apply a small amount of medication inside the rectum up to your first knuckle/joint three times per day for 6 to 8 weeks.   Thank you for entrusting me with your care and choosing [facility].  [place], PA-C

## 2023-09-09 NOTE — Progress Notes (Signed)
 Chief Complaint: Rectal discomfort Primary GI MD: Dr. General Kenner  HPI: Jacob Dixon is a 63 y.o. male presented for 3-month follow-up of GERD, gastritis, and upper abdominal pain.   He has had episodes of epigastric and RUQ pain.  History of H. pylori and treated with Pylera  in 2020 (eradicated).  We are using Spanish interpreter today.  08/08/2023: Seen by Brian Campanile for chronic abdominal pain, constipation, hemorrhoids.  Put on pantoprazole  40 Mg once daily, MiraLAX , hydrocortisone  suppositories  03/2023 abdominal pelvic CT with contrast: No acute abnormality.  Multiple bilateral benign-appearing kidney cysts.  Mild hepatic steatosis.  No liver masses.  Normal gallbladder.     06/2023 RUQ abdominal ultrasound: Mild diffuse hepatic steatosis, 1.0 cm simple left hepatic cyst.  Normal gallbladder with no gallstones.  CBD 4 mm.  LFTs have been normal.   03/2023 Labs: Normal CBC, LFTs, and Lipase.   05/23/2023 EGD by Dr. General Kenner: Normal.  Biopsies negative for H. pylori and celiac.   10/2018 EGD by Dr. Sandrea Cruel: Chronic active H. pylori gastritis.  Normal esophagus and duodenum.  Treated with Pylera .   10/2018 Colonoscopy by Dr. Sandrea Cruel: One 6 mm tubular adenoma removed from cecum.  Mild diverticulosis and internal hemorrhoids.  Biopsies negative for microscopic colitis.  7-year repeat (due 10/2025).    --------------------TODAY-------------------------  Discussed the use of AI scribe software for clinical note transcription with the patient, who gave verbal consent to proceed.  History of Present Illness He has been experiencing ongoing symptoms related to hemorrhoids, despite previous treatment with suppositories prescribed one month ago. There has been some improvement, but it is not sufficient, as he has not achieved complete resolution.  He describes experiencing mild itching and pain inside the rectum, occurring one to two times per week. These symptoms are sometimes associated with bowel  movements, but not consistently.  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   Past Medical History:  Diagnosis Date   Arthritis    knees, arms   Asthma    pt. denies   Diverticulosis of colon    Dyslipidemia    GERD without esophagitis    occasional - no current med.   Gout    Hemorrhoids    Hyperlipidemia    Neuromuscular junction disorder (HCC)    Piriformis syndrome    Shoulder impingement syndrome, right 03/2018   Umbilical hernia    Varicocele     Past Surgical History:  Procedure Laterality Date   BICEPT TENODESIS Left 04/07/2018   Procedure: BICEPS TENODESIS;  Surgeon: Wes Hamman, MD;  Location:  S[facility];  Service: Orthopedics;  Laterality: Left;    Current Outpatient Medications  Medication Sig Dispense Refill   alfuzosin (UROXATRAL) 10 MG 24 hr tablet Take 10 mg by mouth daily.     Multiple Vitamin (MULTIVITAMIN PO) Take 1 tablet by mouth daily.     NON FORMULARY Diltiazem 2%/Lidocaine5% compound Use 3 x rectally daily for 2 months to heal anal fissure 30 g 1   pantoprazole  (PROTONIX ) 40 MG tablet Take 1 tablet (40 mg total) by mouth daily. 90 tablet 3   polyethylene glycol (MIRALAX  / GLYCOLAX ) 17 g packet Take 17 g by mouth daily.     hydrocortisone  (ANUSOL -HC) 25 MG suppository Place 1 suppository (25 mg total) rectally at bedtime. (Patient not taking: Reported on 09/09/2023) 12 suppository 1   PROCTO-MED HC  2.5 % rectal cream Place 1 Application rectally  2 (two) times daily as needed for hemorrhoids. (Patient not taking: Reported on 09/09/2023)     Current Facility-Administered Medications  Medication Dose Route Frequency Provider Last Rate Last Admin   0.9 %  sodium chloride  infusion  500 mL Intravenous Once Armbruster, Lendon Queen, MD        Allergies as of 09/09/2023   (No Known Allergies)    Family History  Problem Relation Age of Onset   Uterine cancer  Mother        ? type   Other Father        old age   Kidney disease Sister    Colon cancer Neg Hx    Colon polyps Neg Hx    Esophageal cancer Neg Hx    Rectal cancer Neg Hx    Stomach cancer Neg Hx    Pancreatic cancer Neg Hx     Social History   Socioeconomic History   Marital status: Married    Spouse name: Not on file   Number of children: 3   Years of education: Not on file   Highest education level: Not on file  Occupational History   Not on file  Tobacco Use   Smoking status: Never   Smokeless tobacco: Never  Vaping Use   Vaping status: Never Used  Substance and Sexual Activity   Alcohol use: No   Drug use: Never   Sexual activity: Not Currently  Other Topics Concern   Not on file  Social History Narrative   Not on file   Social Drivers of Health   Financial Resource Strain: Not on file  Food Insecurity: Low Risk  (07/26/2023)   Received from [facility]   Hunger Vital Sign    Worried About Running Out of Food in the Last Year: Never true    Ran Out of Food in the Last Year: Never true  Transportation Needs: No Transportation Needs (07/26/2023)   Received from [facility]    In the past 12 months, has lack of reliable transportation kept you from medical appointments, meetings, work or from getting things needed for daily living? : No  Physical Activity: Not on file  Stress: Not on file  Social Connections: Not on file  Intimate Partner Violence: Not on file    Review of Systems:    Constitutional: No weight loss, fever, chills, weakness or fatigue HEENT: Eyes: No change in vision               Ears, Nose, Throat:  No change in hearing or congestion Skin: No rash or itching Cardiovascular: No chest pain, chest pressure or palpitations   Respiratory: No SOB or cough Gastrointestinal: See HPI and otherwise negative Genitourinary: No dysuria or change in urinary frequency Neurological: No headache, dizziness or  syncope Musculoskeletal: No new muscle or joint pain Hematologic: No bleeding or bruising Psychiatric: No history of depression or anxiety    Physical Exam:  Vital signs: BP 122/68   Pulse 80   Ht 5\' 5"  (1.651 m)   Wt 149 lb 2 oz (67.6 kg)   BMI 24.82 kg/m   Constitutional: NAD, alert and cooperative Head:  Normocephalic and atraumatic. Eyes:   PEERL, EOMI. No icterus. Conjunctiva pink. Respiratory: Respirations even and unlabored. Lungs clear to auscultation bilaterally.   No wheezes, crackles, or rhonchi.  Cardiovascular:  Regular rate and rhythm. No peripheral edema, cyanosis or pallor.  Gastrointestinal:  Soft, nondistended, nontender. No rebound or guarding. Normal bowel  sounds. No appreciable masses or hepatomegaly. Rectal: Peterson Brandt CMA chaperone.  Erythematous anal fissure noted at posterior dentate line about 2 cm in length.  Small external hemorrhoid nonthrombosed.  Normal sphincter tone.  Stool brown heme-negative.  Moderate sized internal hemorrhoid. Msk:  Symmetrical without gross deformities. Without edema, no deformity or joint abnormality.  Neurologic:  Alert and  oriented x4;  grossly normal neurologically.  Skin:   Dry and intact without significant lesions or rashes. Psychiatric: Oriented to person, place and time. Demonstrates good judgement and reason without abnormal affect or behaviors.   RELEVANT LABS AND IMAGING: CBC    Component Value Date/Time   WBC 5.6 04/14/2023 1108   RBC 5.37 04/14/2023 1108   HGB 16.9 04/14/2023 1108   HCT 45.9 04/14/2023 1108   PLT 273 04/14/2023 1108   MCV 85.5 04/14/2023 1108   MCV 94.0 02/03/2018 1720   MCH 31.5 04/14/2023 1108   MCHC 36.8 (H) 04/14/2023 1108   RDW 11.4 (L) 04/14/2023 1108   LYMPHSABS 3.2 10/12/2017 0216   MONOABS 0.8 10/12/2017 0216   EOSABS 0.1 10/12/2017 0216   BASOSABS 0.1 10/12/2017 0216    CMP     Component Value Date/Time   NA 131 (L) 04/14/2023 1108   K 3.2 (L) 04/14/2023 1108   CL 95 (L)  04/14/2023 1108   CO2 25 04/14/2023 1108   GLUCOSE 119 (H) 04/14/2023 1108   BUN 7 (L) 04/14/2023 1108   CREATININE 0.99 04/14/2023 1108   CREATININE 0.97 12/24/2015 0854   CALCIUM 9.6 04/14/2023 1108   PROT 7.6 04/14/2023 1108   ALBUMIN 4.2 04/14/2023 1108   AST 20 04/14/2023 1108   ALT 22 04/14/2023 1108   ALKPHOS 59 04/14/2023 1108   BILITOT 1.0 04/14/2023 1108   GFRNONAA >60 04/14/2023 1108   GFRNONAA 89 12/24/2015 0854   GFRAA >60 10/12/2017 0216   GFRAA >89 12/24/2015 0854     Assessment/Plan:   Chronic abdominal pain Secondary to GERD/gastritis/constipation/IBS.  Normal EGD, CT, RUQ ultrasound.  Currently resolved  History of H. pylori gastritis treated with Pylera  in 2020, eradicated  Chronic idiopathic constipation On MiraLAX  with adequate control  Internal hemorrhoids Anal fissure Given suppositories for 12 days at last visit 1 month ago.  Continuing to have itching/discomfort associated with bowel movements though some improvement after suppositories the symptoms have not completely resolved.  Anal fissure noted on exam today. - Diltiazem/lidocaine  cream 3 times daily for 6 to 8 weeks - Continue MiraLAX  to control constipation to prevent hemorrhoids/fissures - If compound cream is caused from preventative can do nitroglycerin - If persistent symptoms consider colonoscopy early  History of colon polyps Due for repeat 10/2025   Suzanna Erp, PA-C  Gastroenterology 09/09/2023, 3:48 PM  Cc: Buena Carmine, NP

## 2023-09-10 NOTE — Progress Notes (Signed)
 Agree with assessment and plan as outlined.

## 2023-09-11 ENCOUNTER — Other Ambulatory Visit: Payer: Self-pay

## 2023-09-11 MED ORDER — NON FORMULARY: 1 refills | Status: AC

## 2023-09-25 ENCOUNTER — Ambulatory Visit: Admitting: Physician Assistant
